# Patient Record
Sex: Male | Born: 1990 | Race: White | Hispanic: No | Marital: Single | Smoking: Former smoker
Health system: Southern US, Community
[De-identification: ages and names within clinical notes are randomized; demographics above are authoritative.]

## PROBLEM LIST (undated history)

## (undated) ENCOUNTER — Emergency Department (HOSPITAL_BASED_OUTPATIENT_CLINIC_OR_DEPARTMENT_OTHER): Admission: EM | Payer: Self-pay | Source: Home / Self Care

## (undated) DIAGNOSIS — Q688 Other specified congenital musculoskeletal deformities: Secondary | ICD-10-CM

## (undated) DIAGNOSIS — N289 Disorder of kidney and ureter, unspecified: Secondary | ICD-10-CM

## (undated) DIAGNOSIS — J45909 Unspecified asthma, uncomplicated: Secondary | ICD-10-CM

---

## 2013-11-10 ENCOUNTER — Emergency Department (HOSPITAL_COMMUNITY)
Admission: EM | Admit: 2013-11-10 | Discharge: 2013-11-11 | Disposition: A | Discharge status: Home / Self Care | Payer: Self-pay | Attending: Emergency Medicine | Admitting: Emergency Medicine

## 2013-11-10 ENCOUNTER — Encounter (HOSPITAL_COMMUNITY): Payer: Self-pay | Admitting: Emergency Medicine

## 2013-11-10 DIAGNOSIS — Z23 Encounter for immunization: Secondary | ICD-10-CM | POA: Insufficient documentation

## 2013-11-10 DIAGNOSIS — Y9301 Activity, walking, marching and hiking: Secondary | ICD-10-CM | POA: Insufficient documentation

## 2013-11-10 DIAGNOSIS — J45909 Unspecified asthma, uncomplicated: Secondary | ICD-10-CM | POA: Insufficient documentation

## 2013-11-10 DIAGNOSIS — F17200 Nicotine dependence, unspecified, uncomplicated: Secondary | ICD-10-CM | POA: Insufficient documentation

## 2013-11-10 DIAGNOSIS — W5911XA Bitten by nonvenomous snake, initial encounter: Secondary | ICD-10-CM | POA: Insufficient documentation

## 2013-11-10 DIAGNOSIS — R000 Tachycardia, unspecified: Secondary | ICD-10-CM | POA: Insufficient documentation

## 2013-11-10 DIAGNOSIS — T6391XA Toxic effect of contact with unspecified venomous animal, accidental (unintentional), initial encounter: Secondary | ICD-10-CM | POA: Insufficient documentation

## 2013-11-10 DIAGNOSIS — W5901XA Bitten by nonvenomous lizards, initial encounter: Secondary | ICD-10-CM | POA: Insufficient documentation

## 2013-11-10 DIAGNOSIS — Y9289 Other specified places as the place of occurrence of the external cause: Secondary | ICD-10-CM | POA: Insufficient documentation

## 2013-11-10 HISTORY — DX: Unspecified asthma, uncomplicated: J45.909

## 2013-11-10 LAB — COMPREHENSIVE METABOLIC PANEL
ALBUMIN: 4.6 g/dL (ref 3.5–5.2)
ALT: 17 U/L (ref 0–53)
AST: 20 U/L (ref 0–37)
Alkaline Phosphatase: 90 U/L (ref 39–117)
BUN: 7 mg/dL (ref 6–23)
CALCIUM: 9.1 mg/dL (ref 8.4–10.5)
CO2: 22 mEq/L (ref 19–32)
Chloride: 102 mEq/L (ref 96–112)
Creatinine, Ser: 0.67 mg/dL (ref 0.50–1.35)
GFR calc Af Amer: >90 mL/min (ref >90)
Glucose, Bld: 94 mg/dL (ref 70–99)
POTASSIUM: 3 meq/L — AB (ref 3.7–5.3)
Sodium: 144 mEq/L (ref 137–147)
Total Bilirubin: 0.4 mg/dL (ref 0.3–1.2)
Total Protein: 7.8 g/dL (ref 6.0–8.3)

## 2013-11-10 LAB — CBC WITH DIFFERENTIAL/PLATELET
BASOS ABS: 0.1 10*3/uL (ref 0.0–0.1)
BASOS PCT: 1 % (ref 0–1)
EOS ABS: 0.1 10*3/uL (ref 0.0–0.7)
EOS PCT: 1 % (ref 0–5)
HCT: 47.4 % (ref 39.0–52.0)
Hemoglobin: 16.4 g/dL (ref 13.0–17.0)
LYMPHS ABS: 5.9 10*3/uL — AB (ref 0.7–4.0)
Lymphocytes Relative: 45 % (ref 12–46)
MCH: 31.2 pg (ref 26.0–34.0)
MCHC: 34.6 g/dL (ref 30.0–36.0)
MCV: 90.3 fL (ref 78.0–100.0)
Monocytes Absolute: 1 10*3/uL (ref 0.1–1.0)
Monocytes Relative: 8 % (ref 3–12)
NEUTROS PCT: 47 % (ref 43–77)
Neutro Abs: 6.2 10*3/uL (ref 1.7–7.7)
PLATELETS: 268 10*3/uL (ref 150–400)
RBC: 5.25 MIL/uL (ref 4.22–5.81)
RDW: 13.4 % (ref 11.5–15.5)
WBC: 13.2 10*3/uL — ABNORMAL HIGH (ref 4.0–10.5)

## 2013-11-10 LAB — PROTIME-INR
INR: 1.01 (ref 0.00–1.49)
PROTHROMBIN TIME: 13.1 s (ref 11.6–15.2)

## 2013-11-10 LAB — FIBRINOGEN: Fibrinogen: 218 mg/dL (ref 204–475)

## 2013-11-10 MED ORDER — SODIUM CHLORIDE 0.9 % IV SOLN
1000.0000 mL | Freq: Once | INTRAVENOUS | Status: AC
  Administered 2013-11-10: 1000 mL via INTRAVENOUS

## 2013-11-10 MED ORDER — SODIUM CHLORIDE 0.9 % IV SOLN
1000.0000 mL | INTRAVENOUS | Status: DC
  Administered 2013-11-10: 1000 mL via INTRAVENOUS

## 2013-11-10 MED ORDER — TETANUS-DIPHTH-ACELL PERTUSSIS 5-2.5-18.5 LF-MCG/0.5 IM SUSP
0.5000 mL | Freq: Once | INTRAMUSCULAR | Status: AC
  Administered 2013-11-10: 0.5 mL via INTRAMUSCULAR
  Filled 2013-11-10: qty 0.5

## 2013-11-10 MED ORDER — HYDROMORPHONE HCL PF 1 MG/ML IJ SOLN
1.0000 mg | Freq: Once | INTRAMUSCULAR | Status: AC
  Administered 2013-11-10: 1 mg via INTRAVENOUS
  Filled 2013-11-10: qty 1

## 2013-11-10 NOTE — ED Provider Notes (Signed)
CSN: [id number]     Arrival date & time 11/10/13  2219 History   First MD Initiated Contact with Patient 11/10/13 2222     Chief Complaint  Patient presents with  . Snake Bite     (Consider location/radiation/quality/duration/timing/severity/associated sxs/prior Treatment) The history is provided by the patient and medical records.   This is a 23 year old male with past medical history significant for asthma, presenting to the ED for a snake bite to his right thumb. Bite occurred approximately 30 minutes prior to arrival while walking on a path outside of an apartment complex.  Pt states he does not know exactly what kind of snake it was, but appeared short in length and was tan with some sort of stripe. He states he's had sudden onset of pain, swelling, and bruising to his right thumb with some radiation into his right hand.  He denies numbness or paresthesias of right hand. Patient is right-hand dominant. Date of last tetanus unknown.  No ice or tourniquet was applied prior to arrival.  Pt tachycardic on arrival.  Past Medical History  Diagnosis Date  . Asthma    No past surgical history on file. No family history on file. History  Substance Use Topics  . Smoking status: Current Every Day Smoker -- 1.00 packs/day    Types: Cigarettes  . Smokeless tobacco: Not on file  . Alcohol Use: Yes     Comment: socially    Review of Systems  Skin: Positive for wound.  All other systems reviewed and are negative.     Allergies  Review of patient's allergies indicates no known allergies.  Home Medications   Prior to Admission medications   Not on File   BP 165/97  Temp(Src) 98.5 F (36.9 C) (Oral)  Resp 20  Ht 6\' 6"  (1.981 m)  Wt 180 lb (81.647 kg)  BMI 20.81 kg/m2  SpO2 99%  Physical Exam  Nursing note and vitals reviewed. Constitutional: He is oriented to person, place, and time. He appears well-developed and well-nourished. No distress.  HENT:  Head: Normocephalic and  atraumatic.  Mouth/Throat: Oropharynx is clear and moist.  Eyes: Conjunctivae and EOM are normal. Pupils are equal, round, and reactive to light.  Neck: Normal range of motion. Neck supple.  Cardiovascular: Regular rhythm and normal heart sounds.  Tachycardia present.   Pulmonary/Chest: Effort normal and breath sounds normal. No respiratory distress. He has no wheezes.  Abdominal: Soft. Bowel sounds are normal. There is no tenderness. There is no guarding.  Musculoskeletal: Normal range of motion. He exhibits no edema.       Right hand: He exhibits swelling.       Hands: Right distal thumb with obvious snake bite; 2 fang marks noted without active bleeding, some surrounding bruising at distal tip; surrounding swelling, erythema, and warmth to touch extending down thumb to MCP joint; no streaking into hand; full flexion/extension thumb maintained; strong radial pulse and cap refill; sensation intact diffusely throughout thumb and hand  Neurological: He is alert and oriented to person, place, and time.  Skin: Skin is warm and dry. He is not diaphoretic.  Psychiatric: He has a normal mood and affect.    ED Course  Procedures (including critical care time) Labs Review Labs Reviewed  CBC WITH DIFFERENTIAL - Abnormal; Notable for the following:    WBC 13.2 (*)    Lymphs Abs 5.9 (*)    All other components within normal limits  COMPREHENSIVE METABOLIC PANEL - Abnormal; Notable for the  following:    Potassium 3.0 (*)    All other components within normal limits  PROTIME-INR  FIBRINOGEN  COMPREHENSIVE METABOLIC PANEL  CBC WITH DIFFERENTIAL  FIBRINOGEN  PROTIME-INR    Imaging Review No results found.   EKG Interpretation None      MDM   Final diagnoses:  Snake bite   23 y.o. M with snake bite to right thumb 30 mins PTA.  Unidentified breed of snake.  On exam pt is afebrile and overall non-toxic appearing, tachycardia noted.  Hand has has bruising to distal tip with 2 fang marks  present; erythema, swelling, and warmth to touch extending down thumb to MCP joint. Pts hand was elevated in finger traps, area of redness and swelling marked.  NO ICE OR TOURNIQUET WAS APPLIED.  Tetanus updated.  Labs including CBC, CMP, PT-INR, and fibrinogen obtained.  Dilaudid given for pain.  Discussed with poison control-- Recommended observe for minimum of 6 hours with repeat labs at 4-6 hour intervals.  If redness/swelling extends past the next major joint line (wrist) or any signs of toxicity, should administer CroFab.  Labs reassuring.  Pt has been having Q9630mins re-checks, pain is controlled.  At 2345 erythema and swelling has extended slightly past thumb MCP joint but has not crossed wrist joint at this time.  Pt remains afebrile and non-toxic appearing.  His VS have stabilized.  Will plan observation for the recommended 6 hours, continue Q30 min checks.  Will administer CroFab if erythema, swelling, bruising extends beyond wrist or signs of toxicity develop.  Care signed out to Dr. Jodi MourningZavitz-- will continue monitoring closely and check repeat labs.  Garlon HatchetLisa M Arvil Utz, PA-C 11/11/13 0120

## 2013-11-10 NOTE — ED Notes (Signed)
Pt states that he was bitten to rt thumb by an unknown snake at 2200. Pt describes it as a small "baby" snake but it was dark and was unable to determine any markings; pt c/o swelling and pain to thumb area radiating to rt hand.

## 2013-11-11 LAB — CBC WITH DIFFERENTIAL/PLATELET
BASOS ABS: 0.1 10*3/uL (ref 0.0–0.1)
Basophils Relative: 0 % (ref 0–1)
Eosinophils Absolute: 0.1 10*3/uL (ref 0.0–0.7)
Eosinophils Relative: 0 % (ref 0–5)
HCT: 42.4 % (ref 39.0–52.0)
Hemoglobin: 14.5 g/dL (ref 13.0–17.0)
LYMPHS ABS: 3.4 10*3/uL (ref 0.7–4.0)
LYMPHS PCT: 28 % (ref 12–46)
MCH: 30.9 pg (ref 26.0–34.0)
MCHC: 34.2 g/dL (ref 30.0–36.0)
MCV: 90.2 fL (ref 78.0–100.0)
Monocytes Absolute: 1.1 10*3/uL — ABNORMAL HIGH (ref 0.1–1.0)
Monocytes Relative: 9 % (ref 3–12)
Neutro Abs: 7.6 10*3/uL (ref 1.7–7.7)
Neutrophils Relative %: 62 % (ref 43–77)
PLATELETS: 198 10*3/uL (ref 150–400)
RBC: 4.7 MIL/uL (ref 4.22–5.81)
RDW: 13.6 % (ref 11.5–15.5)
WBC: 12.2 10*3/uL — ABNORMAL HIGH (ref 4.0–10.5)

## 2013-11-11 LAB — COMPREHENSIVE METABOLIC PANEL
ALBUMIN: 3.7 g/dL (ref 3.5–5.2)
ALK PHOS: 69 U/L (ref 39–117)
ALT: 13 U/L (ref 0–53)
AST: 16 U/L (ref 0–37)
BUN: 5 mg/dL — AB (ref 6–23)
CO2: 21 mEq/L (ref 19–32)
CREATININE: 0.63 mg/dL (ref 0.50–1.35)
Calcium: 8.2 mg/dL — ABNORMAL LOW (ref 8.4–10.5)
Chloride: 102 mEq/L (ref 96–112)
GFR calc Af Amer: >90 mL/min (ref >90)
Glucose, Bld: 95 mg/dL (ref 70–99)
Potassium: 3.9 mEq/L (ref 3.7–5.3)
Sodium: 138 mEq/L (ref 137–147)
Total Bilirubin: 0.3 mg/dL (ref 0.3–1.2)
Total Protein: 6.1 g/dL (ref 6.0–8.3)

## 2013-11-11 LAB — PROTIME-INR
INR: 1.1 (ref 0.00–1.49)
PROTHROMBIN TIME: 14 s (ref 11.6–15.2)

## 2013-11-11 LAB — FIBRINOGEN: FIBRINOGEN: 184 mg/dL — AB (ref 204–475)

## 2013-11-11 MED ORDER — FENTANYL CITRATE 0.05 MG/ML IJ SOLN
75.0000 ug | Freq: Once | INTRAMUSCULAR | Status: AC
  Administered 2013-11-11: 75 ug via INTRAVENOUS
  Filled 2013-11-11: qty 2

## 2013-11-11 MED ORDER — HYDROMORPHONE HCL PF 1 MG/ML IJ SOLN
1.0000 mg | INTRAMUSCULAR | Status: DC | PRN
  Administered 2013-11-11 (×2): 1 mg via INTRAVENOUS
  Filled 2013-11-11 (×2): qty 1

## 2013-11-11 MED ORDER — HYDROCODONE-ACETAMINOPHEN 5-325 MG PO TABS: 1.0000 | ORAL_TABLET | ORAL | Status: DC | PRN

## 2013-11-11 NOTE — ED Provider Notes (Signed)
Signed out plan to monitor for the morning and repeat labs and recheck. Multiple rechecks and pain medicines given to improve pain. Minimal severity based on [facility] pharmacy protocol. Ecchymosis and swelling limited to the distal thumb and mild erythema did not progress to the wrist. Patient has distal sensation and despite swelling patient has normal cap refill.   Patient low risk based on guidelines and plan for close followup outpatient. Strict reasons to return given.   Labs Reviewed  CBC WITH DIFFERENTIAL - Abnormal; Notable for the following:    WBC 13.2 (*)    Lymphs Abs 5.9 (*)    All other components within normal limits  COMPREHENSIVE METABOLIC PANEL - Abnormal; Notable for the following:    Potassium 3.0 (*)    All other components within normal limits  COMPREHENSIVE METABOLIC PANEL - Abnormal; Notable for the following:    BUN 5 (*)    Calcium 8.2 (*)    All other components within normal limits  CBC WITH DIFFERENTIAL - Abnormal; Notable for the following:    WBC 12.2 (*)    Monocytes Absolute 1.1 (*)    All other components within normal limits  FIBRINOGEN - Abnormal; Notable for the following:    Fibrinogen 184 (*)    All other components within normal limits  PROTIME-INR  FIBRINOGEN  PROTIME-INR   Araceli Bouche Thomson Herbers   Enid Skeens, MD 11/11/13 [phone]

## 2013-11-11 NOTE — ED Provider Notes (Signed)
Medical screening examination/treatment/procedure(s) were conducted as a shared visit with non-physician practitioner(s) and myself.  I personally evaluated the patient during the encounter.   EKG Interpretation None      I interviewed and examined the patient. Lungs are CTAB. Cardiac exam wnl. Abdomen soft.  Snake bite to right thumb. Affected extremity elevated and immobilized. Initiated pain control and IVF. Screening labs sent. Pt continues to appear well on repeat checks w/ no progression of swelling past the wrist on my last assessment. Pt will be observed in the ED for a minimum of 6 hrs per poison control.   Junius Argyle, MD 11/11/13 1210

## 2013-11-11 NOTE — Discharge Instructions (Signed)
Keep hand elevated as often as possible Return to the ER if the bruising or redness spreads into your wrist or you develop new or worsening symptoms  For severe pain take norco or vicodin however realize they have the potential for addiction and it can make you sleepy and has tylenol in it.  No operating machinery while taking.  If you were given medicines take as directed.  If you are on coumadin or contraceptives realize their levels and effectiveness is altered by many different medicines.  If you have any reaction (rash, tongues swelling, other) to the medicines stop taking and see a physician.   Please follow up as directed and return to the ER or see a physician for new or worsening symptoms.  Thank you. Filed Vitals:   11/10/13 2228 11/11/13 0122 11/11/13 0356 11/11/13 0551  BP: 165/97 102/81 131/73 127/63  Pulse:  79 70 70  Temp: 98.5 F (36.9 C)   98 F (36.7 C)  TempSrc: Oral   Oral  Resp: 20 12    Height: 6\' 6"  (1.981 m)     Weight: 180 lb (81.647 kg)     SpO2: 99% 98% 96% 98%

## 2014-11-24 ENCOUNTER — Emergency Department (HOSPITAL_COMMUNITY)
Admission: EM | Admit: 2014-11-24 | Discharge: 2014-11-24 | Disposition: A | Discharge status: Home / Self Care | Payer: No Typology Code available for payment source | Attending: Emergency Medicine | Admitting: Emergency Medicine

## 2014-11-24 ENCOUNTER — Encounter (HOSPITAL_COMMUNITY): Payer: Self-pay | Admitting: *Deleted

## 2014-11-24 DIAGNOSIS — W25XXXA Contact with sharp glass, initial encounter: Secondary | ICD-10-CM | POA: Diagnosis not present

## 2014-11-24 DIAGNOSIS — Y939 Activity, unspecified: Secondary | ICD-10-CM | POA: Diagnosis not present

## 2014-11-24 DIAGNOSIS — Z720 Tobacco use: Secondary | ICD-10-CM | POA: Insufficient documentation

## 2014-11-24 DIAGNOSIS — J45909 Unspecified asthma, uncomplicated: Secondary | ICD-10-CM | POA: Diagnosis not present

## 2014-11-24 DIAGNOSIS — Y929 Unspecified place or not applicable: Secondary | ICD-10-CM | POA: Diagnosis not present

## 2014-11-24 DIAGNOSIS — S61411A Laceration without foreign body of right hand, initial encounter: Secondary | ICD-10-CM | POA: Insufficient documentation

## 2014-11-24 DIAGNOSIS — Y999 Unspecified external cause status: Secondary | ICD-10-CM | POA: Insufficient documentation

## 2014-11-24 DIAGNOSIS — Z87448 Personal history of other diseases of urinary system: Secondary | ICD-10-CM | POA: Diagnosis not present

## 2014-11-24 HISTORY — DX: Disorder of kidney and ureter, unspecified: N28.9

## 2014-11-24 MED ORDER — LIDOCAINE-EPINEPHRINE (PF) 2 %-1:200000 IJ SOLN
10.0000 mL | Freq: Once | INTRAMUSCULAR | Status: DC
  Filled 2014-11-24: qty 20

## 2014-11-24 NOTE — ED Notes (Signed)
Pt stable, ambulatory, brother at bedside, states understanding of discharge instructions

## 2014-11-24 NOTE — ED Notes (Signed)
Patient cut palm of right hand with a glass  3 inch laceration.  Area wrapped by EMS  Ibuprofen taken at home

## 2014-11-24 NOTE — Discharge Instructions (Signed)
Sutures may be removed in 5-7 days. This can be done by urgent care, your doctor, or the ER.  Watch for signs of infection:  Redness, swelling, drainage or pus   Sutured Wound Care Sutures are stitches that can be used to close wounds. Wound care helps prevent pain and infection.  HOME CARE INSTRUCTIONS   Rest and elevate the injured area until all the pain and swelling are gone.  Only take over-the-counter or prescription medicines for pain, discomfort, or fever as directed by your caregiver.  After 48 hours, gently wash the area with mild soap and water once a day, or as directed. Rinse off the soap. Pat the area dry with a clean towel. Do not rub the wound. This may cause bleeding.  Follow your caregiver's instructions for how often to change the bandage (dressing). Stop using a dressing after 2 days or after the wound stops draining.  If the dressing sticks, moisten it with soapy water and gently remove it.  Apply ointment on the wound as directed.  Avoid stretching a sutured wound.  Drink enough fluids to keep your urine clear or pale yellow.  Follow up with your caregiver for suture removal as directed.  Use sunscreen on your wound for the next 3 to 6 months so the scar will not darken. SEEK IMMEDIATE MEDICAL CARE IF:   Your wound becomes red, swollen, hot, or tender.  You have increasing pain in the wound.  You have a red streak that extends from the wound.  There is pus coming from the wound.  You have a fever.  You have shaking chills.  There is a bad smell coming from the wound.  You have persistent bleeding from the wound. MAKE SURE YOU:   Understand these instructions.  Will watch your condition.  Will get help right away if you are not doing well or get worse. Document Released: 07/14/2004 Document Revised: 08/29/2011 Document Reviewed: 10/10/2010 [facility] Patient Information 2015 [employer], [place]. This information is not intended to replace advice given  to you by your health care provider. Make sure you discuss any questions you have with your health care provider.

## 2014-11-24 NOTE — ED Provider Notes (Addendum)
CSN: [id number]     Arrival date & time 11/24/14  0104 History  This chart was scribed for Marisa Severinlga Usbaldo Pannone, MD by Bronson CurbJacqueline Melvin, ED Scribe. This patient was seen in room D32C/D32C and the patient's care was started at 3:36 AM.   Chief Complaint  Patient presents with  . Laceration    The history is provided by the patient. No language interpreter was used.     HPI Comments: Terry Simmons is a 24 y.o. male who presents to the Emergency Department complaining of a laceration to the palmar aspect of the right hand sustained when he cut his hand on a piece of glass PTA. There is associated constant, 8/10 pain to the area. Bleeding is controlled with bandage applied by EMS. Patient has taken ibuprofen PTA. He denies any other injuries. Tetanus is UTD. NKDA.   Past Medical History  Diagnosis Date  . Asthma   . Renal disorder    History reviewed. No pertinent past surgical history. No family history on file. History  Substance Use Topics  . Smoking status: Current Every Day Smoker -- 1.00 packs/day    Types: Cigarettes  . Smokeless tobacco: Never Used  . Alcohol Use: Yes     Comment: socially    Review of Systems  Musculoskeletal: Positive for myalgias.  Skin: Positive for wound.      Allergies  Review of patient's allergies indicates no known allergies.  Home Medications   Prior to Admission medications   Medication Sig Start Date End Date Taking? Authorizing Provider  HYDROcodone-acetaminophen (NORCO) 5-325 MG per tablet Take 1-2 tablets by mouth every 4 (four) hours as needed. 11/11/13   Blane OharaJoshua Zavitz, MD  ibuprofen (ADVIL,MOTRIN) 200 MG tablet Take 400 mg by mouth every 6 (six) hours as needed (pain).    Historical Provider, MD   Triage Vitals: BP 115/61 mmHg  Pulse 75  Temp(Src) 98 F (36.7 C) (Oral)  Resp 18  Ht 6\' 1"  (1.854 m)  Wt 180 lb (81.647 kg)  BMI 23.75 kg/m2  SpO2 99%  Physical Exam  Constitutional: He is oriented to person, place, and time. He  appears well-developed and well-nourished. No distress.  HENT:  Head: Normocephalic and atraumatic.  Eyes: Conjunctivae and EOM are normal.  Neck: Neck supple. No tracheal deviation present.  Cardiovascular: Normal rate.   Pulmonary/Chest: Effort normal. No respiratory distress.  Musculoskeletal: Normal range of motion.  Neurological: He is alert and oriented to person, place, and time.  Skin: Skin is warm and dry. Laceration noted.  4cm laceration to the right palm.  Psychiatric: He has a normal mood and affect. His behavior is normal.  Nursing note and vitals reviewed.   ED Course  Procedures (including critical care time)  DIAGNOSTIC STUDIES: Oxygen Saturation is 100% on room air, normal by my interpretation.    COORDINATION OF CARE: At 0339 Discussed treatment plan with patient which includes laceration repair. Patient agrees.   LACERATION REPAIR PROCEDURE NOTE The patient's identification was confirmed and consent was obtained. This procedure was performed by Marisa Severinlga Martha Soltys, MD at 3:41 AM. Site: Palmar aspect of right hand Sterile procedures observed Anesthetic used (type and amt): 2% lidocaine with epi Suture type/size:4-0 Prolene Length:4 cm # of Sutures: 12 Technique: simple interrupted  Complexity: simple Antibx ointment applied Tetanus UTD Site anesthetized, irrigated with NS, explored without evidence of foreign body, wound well approximated, site covered with dry, sterile dressing.  Patient tolerated procedure well without complications. Instructions for care discussed verbally and  patient provided with additional written instructions for homecare and f/u.   Labs Review Labs Reviewed - No data to display  Imaging Review No results found.   EKG Interpretation None      MDM   Final diagnoses:  Hand laceration, right, initial encounter    24 year old male status post laceration to right hand from glass.  Tetanus is up-to-date.  Laceration  repaired.  I personally performed the services described in this documentation, which was scribed in my presence. The recorded information has been reviewed and is accurate.     Marisa Severin, MD 11/24/14 1610  Marisa Severin, MD 11/24/14 [phone]

## 2018-08-10 DIAGNOSIS — R509 Fever, unspecified: Secondary | ICD-10-CM | POA: Diagnosis not present

## 2018-08-10 DIAGNOSIS — J111 Influenza due to unidentified influenza virus with other respiratory manifestations: Secondary | ICD-10-CM | POA: Diagnosis not present

## 2019-01-06 DIAGNOSIS — Z20828 Contact with and (suspected) exposure to other viral communicable diseases: Secondary | ICD-10-CM | POA: Diagnosis not present

## 2019-05-03 DIAGNOSIS — F1210 Cannabis abuse, uncomplicated: Secondary | ICD-10-CM | POA: Diagnosis not present

## 2019-05-03 DIAGNOSIS — R002 Palpitations: Secondary | ICD-10-CM | POA: Diagnosis not present

## 2019-05-03 DIAGNOSIS — N99114 Postprocedural urethral stricture, male, unspecified: Secondary | ICD-10-CM | POA: Diagnosis not present

## 2019-05-03 DIAGNOSIS — Z0000 Encounter for general adult medical examination without abnormal findings: Secondary | ICD-10-CM | POA: Diagnosis not present

## 2019-05-03 DIAGNOSIS — H43393 Other vitreous opacities, bilateral: Secondary | ICD-10-CM | POA: Diagnosis not present

## 2020-05-03 DIAGNOSIS — J069 Acute upper respiratory infection, unspecified: Secondary | ICD-10-CM | POA: Diagnosis not present

## 2020-05-03 DIAGNOSIS — Z20822 Contact with and (suspected) exposure to covid-19: Secondary | ICD-10-CM | POA: Diagnosis not present

## 2020-05-06 DIAGNOSIS — J029 Acute pharyngitis, unspecified: Secondary | ICD-10-CM | POA: Diagnosis not present

## 2020-06-05 DIAGNOSIS — Z13220 Encounter for screening for lipoid disorders: Secondary | ICD-10-CM | POA: Diagnosis not present

## 2020-06-05 DIAGNOSIS — Z23 Encounter for immunization: Secondary | ICD-10-CM | POA: Diagnosis not present

## 2020-06-05 DIAGNOSIS — Z0000 Encounter for general adult medical examination without abnormal findings: Secondary | ICD-10-CM | POA: Diagnosis not present

## 2020-06-05 DIAGNOSIS — H43393 Other vitreous opacities, bilateral: Secondary | ICD-10-CM | POA: Diagnosis not present

## 2020-10-13 ENCOUNTER — Emergency Department (HOSPITAL_COMMUNITY): Payer: Commercial Managed Care - PPO

## 2020-10-13 ENCOUNTER — Other Ambulatory Visit: Payer: Self-pay

## 2020-10-13 ENCOUNTER — Emergency Department (HOSPITAL_COMMUNITY)
Admission: EM | Admit: 2020-10-13 | Discharge: 2020-10-13 | Disposition: A | Discharge status: Home / Self Care | Payer: Commercial Managed Care - PPO | Attending: Emergency Medicine | Admitting: Emergency Medicine

## 2020-10-13 DIAGNOSIS — N451 Epididymitis: Secondary | ICD-10-CM | POA: Insufficient documentation

## 2020-10-13 DIAGNOSIS — D72829 Elevated white blood cell count, unspecified: Secondary | ICD-10-CM | POA: Insufficient documentation

## 2020-10-13 DIAGNOSIS — R110 Nausea: Secondary | ICD-10-CM | POA: Insufficient documentation

## 2020-10-13 DIAGNOSIS — J45909 Unspecified asthma, uncomplicated: Secondary | ICD-10-CM | POA: Diagnosis not present

## 2020-10-13 DIAGNOSIS — N50812 Left testicular pain: Secondary | ICD-10-CM | POA: Diagnosis present

## 2020-10-13 DIAGNOSIS — F17210 Nicotine dependence, cigarettes, uncomplicated: Secondary | ICD-10-CM | POA: Insufficient documentation

## 2020-10-13 LAB — BASIC METABOLIC PANEL
Anion gap: 10 (ref 5–15)
BUN: 14 mg/dL (ref 6–20)
CO2: 24 mmol/L (ref 22–32)
Calcium: 9.4 mg/dL (ref 8.9–10.3)
Chloride: 104 mmol/L (ref 98–111)
Creatinine, Ser: 0.71 mg/dL (ref 0.61–1.24)
GFR, Estimated: >60 mL/min (ref >60)
Glucose, Bld: 92 mg/dL (ref 70–99)
Potassium: 3.7 mmol/L (ref 3.5–5.1)
Sodium: 138 mmol/L (ref 135–145)

## 2020-10-13 LAB — CBC WITH DIFFERENTIAL/PLATELET
Abs Immature Granulocytes: 0.13 10*3/uL — ABNORMAL HIGH (ref 0.00–0.07)
Basophils Absolute: 0.1 10*3/uL (ref 0.0–0.1)
Basophils Relative: 1 %
Eosinophils Absolute: 0.1 10*3/uL (ref 0.0–0.5)
Eosinophils Relative: 0 %
HCT: 48.3 % (ref 39.0–52.0)
Hemoglobin: 15.9 g/dL (ref 13.0–17.0)
Immature Granulocytes: 1 %
Lymphocytes Relative: 21 %
Lymphs Abs: 2.8 10*3/uL (ref 0.7–4.0)
MCH: 30.1 pg (ref 26.0–34.0)
MCHC: 32.9 g/dL (ref 30.0–36.0)
MCV: 91.5 fL (ref 80.0–100.0)
Monocytes Absolute: 0.9 10*3/uL (ref 0.1–1.0)
Monocytes Relative: 7 %
Neutro Abs: 9.8 10*3/uL — ABNORMAL HIGH (ref 1.7–7.7)
Neutrophils Relative %: 70 %
Platelets: 245 10*3/uL (ref 150–400)
RBC: 5.28 MIL/uL (ref 4.22–5.81)
RDW: 13.5 % (ref 11.5–15.5)
WBC: 13.8 10*3/uL — ABNORMAL HIGH (ref 4.0–10.5)
nRBC: 0 % (ref 0.0–0.2)

## 2020-10-13 LAB — URINALYSIS, ROUTINE W REFLEX MICROSCOPIC
Bilirubin Urine: NEGATIVE
Glucose, UA: NEGATIVE mg/dL
Hgb urine dipstick: NEGATIVE
Ketones, ur: NEGATIVE mg/dL
Nitrite: NEGATIVE
Protein, ur: NEGATIVE mg/dL
Specific Gravity, Urine: 1.025 (ref 1.005–1.030)
pH: 6 (ref 5.0–8.0)

## 2020-10-13 LAB — HIV ANTIBODY (ROUTINE TESTING W REFLEX): HIV Screen 4th Generation wRfx: NONREACTIVE

## 2020-10-13 MED ORDER — LEVOFLOXACIN 500 MG PO TABS: 500.0000 mg | ORAL_TABLET | Freq: Every day | ORAL | 0 refills | Status: AC

## 2020-10-13 MED ORDER — LEVOFLOXACIN 500 MG PO TABS
500.0000 mg | ORAL_TABLET | Freq: Once | ORAL | Status: AC
  Administered 2020-10-13: 500 mg via ORAL
  Filled 2020-10-13: qty 1

## 2020-10-13 MED ORDER — ACETAMINOPHEN 325 MG PO TABS: 650.0000 mg | ORAL_TABLET | Freq: Four times a day (QID) | ORAL | 0 refills | Status: DC | PRN

## 2020-10-13 MED ORDER — IBUPROFEN 800 MG PO TABS: 800.0000 mg | ORAL_TABLET | Freq: Three times a day (TID) | ORAL | 0 refills | Status: AC

## 2020-10-13 NOTE — ED Triage Notes (Signed)
Emergency Medicine Provider Triage Evaluation Note  Terry Simmons , a 30 y.o. male  was evaluated in triage.  Pt complains of testicular pain.  Review of Systems  Positive: Left testicle pain, testicular swelling, bloody ejaculate Negative: Fever, back pain, hematuria, rash, trauma  Physical Exam  BP (!) 153/98 (BP Location: Right Arm)   Pulse 87   Temp 99.1 F (37.3 C) (Oral)   Resp 20   SpO2 100%  Gen:   Awake, no distress   HEENT:  Atraumatic  Resp:  Normal effort  Cardiac:  Normal rate  Abd:   Nondistended, nontender. Chaperone present during exam.  Tenderness to L testicle with firmness without significant erythema or edema. No inguinal lymphadenopathy MSK:   Moves extremities without difficulty  Neuro:  Speech clear   Medical Decision Making  Medically screening exam initiated at 3:30 PM.  Appropriate orders placed.  Adama Ferber [facility] was informed that the remainder of the evaluation will be completed by another provider, this initial triage assessment does not replace that evaluation, and the importance of remaining in the ED until their evaluation is complete.  Clinical Impression  L testicular tenderness 3 hrs ago after he was gardening.  Last sexual activity was 2 days ago.  No hx of kidney stone.    Fayrene Helper, PA-C 10/13/20 [phone]

## 2020-10-13 NOTE — Discharge Instructions (Addendum)
We are treating you for a likely infection of the testicle.  You received your first dose of antibiotic in the ER today.  Your next dose will be tomorrow.  You will take this once a day for the next 9 days.  I have also prescribed you ibuprofen and Tylenol for pain.  We have also tested you for gonorrhea and chlamydia, which are sexually transmitted diseases.  If you test positive for this, you will receive a phone call the next 2 to 3 days.  You will likely need to be on different antibiotics, and may be instructed to go to a nearby urgent care or doctors office for treatment.  If you do test positive for sexual transmitted disease, you need to inform your sexual partner to get tested and treated.  You should avoid for having any sexual contact or ejaculation for 2 weeks while treating this infection.

## 2020-10-13 NOTE — ED Triage Notes (Signed)
Pt presents to ED POV. Pt c/o L testicle pain. Pt reports that he was sitting outside and pain began after sitting down. Pt reports swelling now and blood in semen.

## 2020-10-13 NOTE — ED Provider Notes (Signed)
[facility] EMERGENCY DEPARTMENT Provider Note   CSN: [id number] Arrival date & time: 10/13/20  1508     History Chief Complaint  Patient presents with  . Testicle Pain    Terry Simmons is a 30 y.o. male presented emergency department left testicular pain.  He reports onset of pain around noon today.  He says he was out gardening all day.  He describes a throbbing pain in his left testicle with some nausea.  He says he has had similar episodes in the past and last a few hours and improved on their own.  He does note some bloody ejaculate.  He reports he is in monogamous relationship with his wife, and has not had any other sexual partners in at least 5 years.  He denies any history of STI.  He denies any allergies or any other medical problems.  HPI     Past Medical History:  Diagnosis Date  . Asthma   . Renal disorder     There are no problems to display for this patient.   No past surgical history on file.     No family history on file.  Social History   Tobacco Use  . Smoking status: Current Every Day Smoker    Packs/day: 1.00    Types: Cigarettes  . Smokeless tobacco: Never Used  Substance Use Topics  . Alcohol use: Yes    Comment: socially  . Drug use: Yes    Types: Marijuana    Home Medications Prior to Admission medications   Medication Sig Start Date End Date Taking? Authorizing Provider  acetaminophen (TYLENOL) 325 MG tablet Take 2 tablets (650 mg total) by mouth every 6 (six) hours as needed for up to 30 doses for mild pain or moderate pain. 10/13/20  Yes Terald Sleeper, MD  ibuprofen (ADVIL) 800 MG tablet Take 1 tablet (800 mg total) by mouth 3 (three) times daily for 30 doses. 10/13/20 10/23/20 Yes Nakai Pollio, Kermit Balo, MD  levofloxacin (LEVAQUIN) 500 MG tablet Take 1 tablet (500 mg total) by mouth daily for 9 days. 10/14/20 10/23/20 Yes Trygve Thal, Kermit Balo, MD  HYDROcodone-acetaminophen (NORCO) 5-325 MG per tablet Take 1-2  tablets by mouth every 4 (four) hours as needed. 11/11/13   Blane Ohara, MD  ibuprofen (ADVIL,MOTRIN) 200 MG tablet Take 400 mg by mouth every 6 (six) hours as needed (pain).    [provider]    Allergies    Patient has no known allergies.  Review of Systems   Review of Systems  Constitutional: Negative for chills and fever.  Respiratory: Negative for cough and shortness of breath.   Cardiovascular: Negative for chest pain and palpitations.  Gastrointestinal: Positive for nausea. Negative for abdominal pain and vomiting.  Genitourinary: Positive for scrotal swelling and testicular pain. Negative for dysuria.  Musculoskeletal: Negative for arthralgias and back pain.  Skin: Negative for color change and rash.  Neurological: Negative for seizures and syncope.  All other systems reviewed and are negative.   Physical Exam Updated Vital Signs BP (!) 153/98 (BP Location: Right Arm)   Pulse 87   Temp 99.1 F (37.3 C) (Oral)   Resp 20   SpO2 100%   Physical Exam Constitutional:      General: He is not in acute distress. HENT:     Head: Normocephalic and atraumatic.  Eyes:     Conjunctiva/sclera: Conjunctivae normal.     Pupils: Pupils are equal, round, and reactive to light.  Cardiovascular:  Rate and Rhythm: Normal rate and regular rhythm.  Pulmonary:     Effort: Pulmonary effort is normal. No respiratory distress.  Abdominal:     General: There is no distension.     Tenderness: There is no abdominal tenderness.  Genitourinary:    Penis: Normal and circumcised.      Testes: Normal. Cremasteric reflex is present.        Right: Mass, tenderness, swelling, testicular hydrocele or varicocele not present. Right testis is descended.     Epididymis:     Right: Normal. Not inflamed or enlarged.     Comments: Left testicle mildly enlarged, testicular and epidymal tenderness No discoloration of scrotum Skin:    General: Skin is warm and dry.  Neurological:      General: No focal deficit present.     Mental Status: He is alert. Mental status is at baseline.  Psychiatric:        Mood and Affect: Mood normal.        Behavior: Behavior normal.     ED Results / Procedures / Treatments   Labs (all labs ordered are listed, but only abnormal results are displayed) Labs Reviewed  CBC WITH DIFFERENTIAL/PLATELET - Abnormal; Notable for the following components:      Result Value   WBC 13.8 (*)    Neutro Abs 9.8 (*)    Abs Immature Granulocytes 0.13 (*)    All other components within normal limits  URINALYSIS, ROUTINE W REFLEX MICROSCOPIC - Abnormal; Notable for the following components:   APPearance HAZY (*)    Leukocytes,Ua TRACE (*)    Bacteria, UA RARE (*)    All other components within normal limits  BASIC METABOLIC PANEL  HIV ANTIBODY (ROUTINE TESTING W REFLEX)  RPR  GC/CHLAMYDIA PROBE AMP ([place]) NOT AT [facility]    EKG None  Radiology US SCROTUM W/DOPPLER  Result Date: 10/13/2020 CLINICAL DATA:  Acute left testicular pain. EXAM: SCROTAL ULTRASOUND DOPPLER ULTRASOUND OF THE TESTICLES TECHNIQUE: Complete ultrasound examination of the testicles, epididymis, and other scrotal structures was performed. Color and spectral Doppler ultrasound were also utilized to evaluate blood flow to the testicles. COMPARISON:  None. FINDINGS: Right testicle Measurements: 5.5 x 3.3 x 2.5 cm. No mass or microlithiasis visualized. Left testicle Measurements: 5.0 x 3.8 x 2.8 cm. No mass or microlithiasis visualized. Hypervascular compared to right testicle. Right epididymis:  Normal in size and appearance. Left epididymis:  Tail is hypervascular suggesting epididymitis. Hydrocele:  None visualized. Varicocele:  Small left varicocele is noted. Pulsed Doppler interrogation of both testes demonstrates normal low resistance arterial and venous waveforms bilaterally. IMPRESSION: The left testicle and epididymis are hypervascular suggesting left-sided epididymo-orchitis.  There is no evidence of testicular torsion or mass. Electronically Signed   By: Lupita Raider M.D.   On: 10/13/2020 16:42    Procedures Procedures   Medications Ordered in ED Medications  levofloxacin (LEVAQUIN) tablet 500 mg (500 mg Oral Given 10/13/20 1809)    ED Course  I have reviewed the triage vital signs and the nursing notes.  Pertinent labs & imaging results that were available during my care of the patient were reviewed by me and considered in my medical decision making (see chart for details).  Patient is here with evidence of epididymitis per clinical exam and ultrasound.  Labs reviewed with a mild leukocytosis 13.4.  I do not believe he has sepsis.  I suspect this most likely caused by enteric organism or E. coli.  He reports  he is in a monogamous relationship and does not feel he is at risk for STI.  We agreed that we will test for GC chlamydia, but given his history, I think it is more reasonable to treat him with Levaquin for enteric organisms.  We will start him on a 10-day course.  Advise NSAIDs and Tylenol for pain.  He is aware that if he chlamydia or gonorrhea test positive, he will receive a call and needed alternative treatment for this.  Final Clinical Impression(s) / ED Diagnoses Final diagnoses:  Epididymitis    Rx / DC Orders ED Discharge Orders         Ordered    levofloxacin (LEVAQUIN) 500 MG tablet  Daily        10/13/20 1752    ibuprofen (ADVIL) 800 MG tablet  3 times daily        10/13/20 1752    acetaminophen (TYLENOL) 325 MG tablet  Every 6 hours PRN        10/13/20 1752           Terald Sleeper, MD 10/13/20 2202

## 2020-10-14 LAB — GC/CHLAMYDIA PROBE AMP (~~LOC~~) NOT AT ARMC
Chlamydia: NEGATIVE
Comment: NEGATIVE
Comment: NORMAL
Neisseria Gonorrhea: NEGATIVE

## 2020-10-14 LAB — RPR: RPR Ser Ql: NONREACTIVE

## 2021-03-06 ENCOUNTER — Emergency Department (HOSPITAL_BASED_OUTPATIENT_CLINIC_OR_DEPARTMENT_OTHER): Payer: Commercial Managed Care - PPO

## 2021-03-06 ENCOUNTER — Emergency Department (HOSPITAL_BASED_OUTPATIENT_CLINIC_OR_DEPARTMENT_OTHER)
Admission: EM | Admit: 2021-03-06 | Discharge: 2021-03-06 | Disposition: A | Discharge status: Home / Self Care | Payer: Commercial Managed Care - PPO | Attending: Emergency Medicine | Admitting: Emergency Medicine

## 2021-03-06 ENCOUNTER — Other Ambulatory Visit: Payer: Self-pay

## 2021-03-06 ENCOUNTER — Encounter (HOSPITAL_BASED_OUTPATIENT_CLINIC_OR_DEPARTMENT_OTHER): Payer: Self-pay

## 2021-03-06 DIAGNOSIS — N50812 Left testicular pain: Secondary | ICD-10-CM | POA: Diagnosis present

## 2021-03-06 DIAGNOSIS — I861 Scrotal varices: Secondary | ICD-10-CM | POA: Insufficient documentation

## 2021-03-06 DIAGNOSIS — F17210 Nicotine dependence, cigarettes, uncomplicated: Secondary | ICD-10-CM | POA: Diagnosis not present

## 2021-03-06 DIAGNOSIS — J45909 Unspecified asthma, uncomplicated: Secondary | ICD-10-CM | POA: Diagnosis not present

## 2021-03-06 DIAGNOSIS — N451 Epididymitis: Secondary | ICD-10-CM

## 2021-03-06 LAB — URINALYSIS, ROUTINE W REFLEX MICROSCOPIC
Bilirubin Urine: NEGATIVE
Glucose, UA: NEGATIVE mg/dL
Ketones, ur: 15 mg/dL — AB
Nitrite: NEGATIVE
Specific Gravity, Urine: 1.032 — ABNORMAL HIGH (ref 1.005–1.030)
pH: 6 (ref 5.0–8.0)

## 2021-03-06 MED ORDER — CEFTRIAXONE SODIUM 500 MG IJ SOLR
500.0000 mg | Freq: Once | INTRAMUSCULAR | Status: AC
  Administered 2021-03-06: 500 mg via INTRAMUSCULAR
  Filled 2021-03-06: qty 500

## 2021-03-06 MED ORDER — LIDOCAINE HCL (PF) 1 % IJ SOLN
INTRAMUSCULAR | Status: AC
  Administered 2021-03-06: 5 mL
  Filled 2021-03-06: qty 5

## 2021-03-06 MED ORDER — DOXYCYCLINE HYCLATE 100 MG PO CAPS: 100.0000 mg | ORAL_CAPSULE | Freq: Two times a day (BID) | ORAL | 0 refills | Status: DC

## 2021-03-06 NOTE — ED Triage Notes (Signed)
Pt c/o swollen left testicle. 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 Prescribed Levofloxacin which he reports usually helps after 10 days. Pt is on day 4 of 500MG  dose. He states this feels like every other time, which always consists of L testicle swelling but wanted to confirm. Followed by urology and has an apt for surgery in October.

## 2021-03-06 NOTE — ED Provider Notes (Signed)
[facility] EMERGENCY DEPT Provider Note   CSN: [id number] Arrival date & time: 03/06/21  1502     History No chief complaint on file.   Terry Simmons is a 30 y.o. male.  HPI  Patient presents with left testicular pain and swelling.  This started 4 days ago, he has history of the same.  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  He is on day 4 of Levaquin.  Reports that that has not been helping, in the past it has.  Has not had any fevers at home, does note the occasional discharge from the penis.  Denies any recent blood in the urine, states he has had blood in the urethral meatus in the past but that was over a month ago.  Past Medical History:  Diagnosis Date  . Asthma   . Renal disorder     There are no problems to display for this patient.   History reviewed. No pertinent surgical history.     No family history on file.  Social History   Tobacco Use  . Smoking status: Every Day    Packs/day: 1.00    Types: Cigarettes  . Smokeless tobacco: Never  Substance Use Topics  . Alcohol use: Yes    Comment: socially  . Drug use: Yes    Types: Marijuana    Home Medications Prior to Admission medications   Medication Sig Start Date End Date Taking? Authorizing Provider  acetaminophen (TYLENOL) 325 MG tablet Take 2 tablets (650 mg total) by mouth every 6 (six) hours as needed for up to 30 doses for mild pain or moderate pain. 10/13/20   Terald Sleeper, MD  HYDROcodone-acetaminophen (NORCO) 5-325 MG per tablet Take 1-2 tablets by mouth every 4 (four) hours as needed. 11/11/13   Blane Ohara, MD  ibuprofen (ADVIL,MOTRIN) 200 MG tablet Take 400 mg by mouth every 6 (six) hours as needed (pain).    [provider]    Allergies    Patient has no known allergies.  Review of Systems   Review of Systems  Constitutional:  Negative for chills, fatigue and fever.  HENT:  Negative for ear  pain and sore throat.   Eyes:  Negative for pain and visual disturbance.  Respiratory:  Negative for cough and shortness of breath.   Cardiovascular:  Negative for chest pain and palpitations.  Gastrointestinal:  Negative for abdominal pain and vomiting.  Genitourinary:  Positive for penile discharge, scrotal swelling and testicular pain. Negative for dysuria, hematuria and penile swelling.  Musculoskeletal:  Negative for arthralgias and back pain.  Skin:  Negative for color change and rash.  Neurological:  Negative for seizures and syncope.  All other systems reviewed and are negative.  Physical Exam Updated Vital Signs BP 137/88 (BP Location: Left Arm)   Pulse 90   Temp (!) 97 F (36.1 C) (Oral)   Resp 18   Ht 6\' 1"  (1.854 m)   Wt 111.1 kg   SpO2 99%   BMI 32.32 kg/m   Physical Exam Vitals and nursing note reviewed. Exam conducted with a chaperone present.  Constitutional:      General: He is not in acute distress.    Appearance: Normal appearance.  HENT:     Head: Normocephalic and atraumatic.  Eyes:     General: No scleral icterus.    Extraocular Movements: Extraocular movements intact.     Pupils: Pupils are equal,  round, and reactive to light.  Cardiovascular:     Rate and Rhythm: Normal rate and regular rhythm.     Pulses: Normal pulses.     Heart sounds: Normal heart sounds.  Pulmonary:     Effort: Pulmonary effort is normal.     Breath sounds: Normal breath sounds.  Genitourinary:    Penis: Normal.      Comments: No penile discharge, urethra without any erythema.  Left testicle is erythematous and swollen. Skin:    Coloration: Skin is not jaundiced.  Neurological:     Mental Status: He is alert. Mental status is at baseline.     Coordination: Coordination normal.    ED Results / Procedures / Treatments   Labs (all labs ordered are listed, but only abnormal results are displayed) Labs Reviewed  URINALYSIS, ROUTINE W REFLEX MICROSCOPIC  GC/CHLAMYDIA  PROBE AMP ([place]) NOT AT [facility]    EKG None  Radiology No results found.  Procedures Procedures   Medications Ordered in ED Medications - No data to display  ED Course  I have reviewed the triage vital signs and the nursing notes.  Pertinent labs & imaging results that were available during my care of the patient were reviewed by me and considered in my medical decision making (see chart for details).    MDM Rules/Calculators/A&P                           Patient vitals are stable, he has known history of epididymitis.  He is having testicular swelling so we will rule out testicular torsion with Doppler ultrasound.  We will check urine for UTI symptoms as well as gonorrhea and chlamydia given he is having penile discharge.  Patient has not been having any improvement on fluoroquinolones, will give him a shot of Rocephin and start him on doxycycline.  Ultrasound negative for torsion.  He does have left epidermidis as well as a possible right orchitis.  He is stable at this time, has urology follow-up scheduled for October.  Advised patient to proceed with that plan and to schedule follow-up with his urologist in the next 10 days if there is no improvement at the end of the antibiotic course.  Final Clinical Impression(s) / ED Diagnoses Final diagnoses:  None    Rx / DC Orders ED Discharge Orders     None        Theron Arista, PA-C 03/06/21 1910    Rozelle Logan, DO 03/07/21 1522

## 2021-03-06 NOTE — Discharge Instructions (Addendum)
Take doxycycline twice daily for the next 10 days. Follow-up with urologist as planned in October. If things change or worsen return back to the ED as needed.

## 2021-03-08 ENCOUNTER — Encounter (HOSPITAL_BASED_OUTPATIENT_CLINIC_OR_DEPARTMENT_OTHER): Payer: Self-pay

## 2021-06-17 ENCOUNTER — Other Ambulatory Visit: Payer: Self-pay

## 2021-06-17 ENCOUNTER — Encounter (HOSPITAL_BASED_OUTPATIENT_CLINIC_OR_DEPARTMENT_OTHER): Payer: Self-pay | Admitting: *Deleted

## 2021-06-17 ENCOUNTER — Emergency Department (HOSPITAL_BASED_OUTPATIENT_CLINIC_OR_DEPARTMENT_OTHER)
Admission: EM | Admit: 2021-06-17 | Discharge: 2021-06-17 | Disposition: A | Discharge status: Home / Self Care | Payer: Commercial Managed Care - PPO | Attending: Emergency Medicine | Admitting: Emergency Medicine

## 2021-06-17 DIAGNOSIS — Z202 Contact with and (suspected) exposure to infections with a predominantly sexual mode of transmission: Secondary | ICD-10-CM | POA: Diagnosis not present

## 2021-06-17 DIAGNOSIS — Z5321 Procedure and treatment not carried out due to patient leaving prior to being seen by health care provider: Secondary | ICD-10-CM | POA: Diagnosis not present

## 2021-06-17 NOTE — ED Notes (Signed)
Pt has elected to leave facility.

## 2021-06-17 NOTE — ED Triage Notes (Addendum)
Pt concerned his wife tested Positive for GC/chlamydia, he presently has no symptoms but was told to come for testing.

## 2022-08-22 ENCOUNTER — Emergency Department (HOSPITAL_COMMUNITY)
Admission: EM | Admit: 2022-08-22 | Discharge: 2022-08-22 | Disposition: A | Discharge status: Home / Self Care | Payer: Commercial Managed Care - PPO | Attending: Emergency Medicine | Admitting: Emergency Medicine

## 2022-08-22 ENCOUNTER — Encounter (HOSPITAL_COMMUNITY): Payer: Self-pay | Admitting: Emergency Medicine

## 2022-08-22 ENCOUNTER — Emergency Department (HOSPITAL_COMMUNITY): Payer: Commercial Managed Care - PPO

## 2022-08-22 DIAGNOSIS — D72829 Elevated white blood cell count, unspecified: Secondary | ICD-10-CM | POA: Insufficient documentation

## 2022-08-22 DIAGNOSIS — N451 Epididymitis: Secondary | ICD-10-CM

## 2022-08-22 DIAGNOSIS — N50812 Left testicular pain: Secondary | ICD-10-CM

## 2022-08-22 LAB — CBC WITH DIFFERENTIAL/PLATELET
Abs Immature Granulocytes: 0.1 10*3/uL — ABNORMAL HIGH (ref 0.00–0.07)
Basophils Absolute: 0.1 10*3/uL (ref 0.0–0.1)
Basophils Relative: 1 %
Eosinophils Absolute: 0 10*3/uL (ref 0.0–0.5)
Eosinophils Relative: 0 %
HCT: 49.7 % (ref 39.0–52.0)
Hemoglobin: 16.8 g/dL (ref 13.0–17.0)
Immature Granulocytes: 1 %
Lymphocytes Relative: 13 %
Lymphs Abs: 1.4 10*3/uL (ref 0.7–4.0)
MCH: 30.5 pg (ref 26.0–34.0)
MCHC: 33.8 g/dL (ref 30.0–36.0)
MCV: 90.4 fL (ref 80.0–100.0)
Monocytes Absolute: 0.9 10*3/uL (ref 0.1–1.0)
Monocytes Relative: 8 %
Neutro Abs: 8.4 10*3/uL — ABNORMAL HIGH (ref 1.7–7.7)
Neutrophils Relative %: 77 %
Platelets: 220 10*3/uL (ref 150–400)
RBC: 5.5 MIL/uL (ref 4.22–5.81)
RDW: 13.2 % (ref 11.5–15.5)
WBC: 10.8 10*3/uL — ABNORMAL HIGH (ref 4.0–10.5)
nRBC: 0 % (ref 0.0–0.2)

## 2022-08-22 LAB — URINALYSIS, ROUTINE W REFLEX MICROSCOPIC
Bilirubin Urine: NEGATIVE
Glucose, UA: NEGATIVE mg/dL
Ketones, ur: 20 mg/dL — AB
Nitrite: POSITIVE — AB
Protein, ur: NEGATIVE mg/dL
Specific Gravity, Urine: 1.014 (ref 1.005–1.030)
WBC, UA: >50 WBC/hpf (ref 0–5)
pH: 6 (ref 5.0–8.0)

## 2022-08-22 LAB — COMPREHENSIVE METABOLIC PANEL
ALT: 17 U/L (ref 0–44)
AST: 18 U/L (ref 15–41)
Albumin: 4 g/dL (ref 3.5–5.0)
Alkaline Phosphatase: 80 U/L (ref 38–126)
Anion gap: 11 (ref 5–15)
BUN: 5 mg/dL — ABNORMAL LOW (ref 6–20)
CO2: 23 mmol/L (ref 22–32)
Calcium: 9.1 mg/dL (ref 8.9–10.3)
Chloride: 102 mmol/L (ref 98–111)
Creatinine, Ser: 0.82 mg/dL (ref 0.61–1.24)
GFR, Estimated: >60 mL/min (ref >60)
Glucose, Bld: 111 mg/dL — ABNORMAL HIGH (ref 70–99)
Potassium: 3.7 mmol/L (ref 3.5–5.1)
Sodium: 136 mmol/L (ref 135–145)
Total Bilirubin: 0.8 mg/dL (ref 0.3–1.2)
Total Protein: 7.6 g/dL (ref 6.5–8.1)

## 2022-08-22 LAB — HIV ANTIBODY (ROUTINE TESTING W REFLEX): HIV Screen 4th Generation wRfx: NONREACTIVE

## 2022-08-22 MED ORDER — IBUPROFEN 600 MG PO TABS: 600.0000 mg | ORAL_TABLET | Freq: Three times a day (TID) | ORAL | 0 refills | Status: AC | PRN

## 2022-08-22 MED ORDER — CEFTRIAXONE SODIUM 500 MG IJ SOLR: 500.0000 mg | Freq: Once | INTRAMUSCULAR | Status: DC

## 2022-08-22 MED ORDER — SODIUM CHLORIDE 0.9 % IV SOLN
1000.0000 mg | Freq: Once | INTRAVENOUS | Status: AC
  Administered 2022-08-22: 1000 mg via INTRAVENOUS
  Filled 2022-08-22: qty 10

## 2022-08-22 MED ORDER — DOXYCYCLINE HYCLATE 100 MG PO CAPS: 100.0000 mg | ORAL_CAPSULE | Freq: Two times a day (BID) | ORAL | 0 refills | Status: AC

## 2022-08-22 NOTE — ED Triage Notes (Signed)
Hx epididymidis  in left testicle and states feels the same. Pain and swelling over the weekend. Took ibuprofen.

## 2022-08-22 NOTE — Discharge Instructions (Signed)
Thank you for letting us take care of you today.  We gave you a shot of Rocephin and will start you on doxycycline to treat your infection. Please follow up with urology in a week to discuss your recurrent epididymitis or sooner for any worsening symptoms. You may take ibuprofen as prescribed for pain or discomfort. You may also take Tylenol as needed. You may take up to '1000mg'$  at once every 6 hours. Do not take more than '4000mg'$  in one day.  Please take the antibiotics as prescribed and complete the entire course even if you start to feel better.  If you develop significantly worsening symptoms such as severe abdominal pain, high fevers, inability urinate, or any other new, emergent concerns, please return to the ED for reevaluation.

## 2022-08-23 LAB — RPR: RPR Ser Ql: NONREACTIVE

## 2022-08-24 NOTE — ED Provider Notes (Signed)
[facility] Provider Note   CSN: [id number] Arrival date & time: 08/22/22  [id number]     History  Chief Complaint  Patient presents with   Testicle Pain    Terry Simmons is a 32 y.o. male presenting to ED c/o left testicular pain over the last 2 to 3 days. 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 He reports he has taken Levaquin before but it does not seem to work well and symptoms did not fully resolve. He reports last episode in fall 2022 he was prescribed doxycycline and had good relief of symptoms with this medication. He denies fever, chills, abdominal pain, dysuria, hematuria, back pain, nausea, vomiting, or diarrhea. He requests STD testing while here. He denies history of previous STDs. He denies injury to testicles or scrotum.        Home Medications Prior to Admission medications   Medication Sig Start Date End Date Taking? Authorizing Provider  doxycycline (VIBRAMYCIN) 100 MG capsule Take 1 capsule (100 mg total) by mouth 2 (two) times daily for 10 days. 08/22/22 09/01/22 Yes Marck Mcclenny L, PA-C  ibuprofen (ADVIL) 600 MG tablet Take 1 tablet (600 mg total) by mouth every 8 (eight) hours as needed for up to 5 days for mild pain or moderate pain. 08/22/22 08/27/22 Yes Alexiana Laverdure L, PA-C  acetaminophen (TYLENOL) 325 MG tablet Take 2 tablets (650 mg total) by mouth every 6 (six) hours as needed for up to 30 doses for mild pain or moderate pain. 10/13/20   Wyvonnia Dusky, MD  HYDROcodone-acetaminophen (NORCO) 5-325 MG per tablet Take 1-2 tablets by mouth every 4 (four) hours as needed. 11/11/13   Elnora Morrison, MD  penicillin v potassium (VEETID) 500 MG tablet Take 500 mg by mouth 4 (four) times daily.    [provider]      Allergies    Patient has no known allergies.    Review of Systems   Review of Systems  All other systems reviewed and are negative.   Physical  Exam Updated Vital Signs BP (!) 138/93   Pulse 98   Temp 98.2 F (36.8 C) (Oral)   Resp 18   SpO2 97%  Physical Exam Vitals and nursing note reviewed. Exam conducted with a chaperone present.  Constitutional:      General: He is not in acute distress.    Appearance: Normal appearance. He is not ill-appearing or toxic-appearing.  HENT:     Head: Normocephalic and atraumatic.     Mouth/Throat:     Mouth: Mucous membranes are moist.  Eyes:     General: No scleral icterus.    Conjunctiva/sclera: Conjunctivae normal.  Cardiovascular:     Rate and Rhythm: Normal rate and regular rhythm.     Heart sounds: No murmur heard. Pulmonary:     Effort: Pulmonary effort is normal.     Breath sounds: Normal breath sounds.  Abdominal:     General: Abdomen is flat. There is no distension.     Palpations: Abdomen is soft. There is no mass.     Tenderness: There is no abdominal tenderness. There is no right CVA tenderness, left CVA tenderness, guarding or rebound.     Hernia: No hernia is present.  Genitourinary:    Penis: Normal.      Testes: Normal.     Comments: No overlying skin changes, no discharge, no rash or lesions Musculoskeletal:  General: Normal range of motion.     Cervical back: Neck supple.     Right lower leg: No edema.     Left lower leg: No edema.  Skin:    General: Skin is warm and dry.     Capillary Refill: Capillary refill takes less than 2 seconds.  Neurological:     Mental Status: He is alert. Mental status is at baseline.  Psychiatric:        Behavior: Behavior normal.     ED Results / Procedures / Treatments   Labs (all labs ordered are listed, but only abnormal results are displayed) Labs Reviewed  CBC WITH DIFFERENTIAL/PLATELET - Abnormal; Notable for the following components:      Result Value   WBC 10.8 (*)    Neutro Abs 8.4 (*)    Abs Immature Granulocytes 0.10 (*)    All other components within normal limits  COMPREHENSIVE METABOLIC PANEL -  Abnormal; Notable for the following components:   Glucose, Bld 111 (*)    BUN 5 (*)    All other components within normal limits  URINALYSIS, ROUTINE W REFLEX MICROSCOPIC - Abnormal; Notable for the following components:   APPearance HAZY (*)    Hgb urine dipstick SMALL (*)    Ketones, ur 20 (*)    Nitrite POSITIVE (*)    Leukocytes,Ua LARGE (*)    Bacteria, UA MANY (*)    All other components within normal limits  RPR  HIV ANTIBODY (ROUTINE TESTING W REFLEX)  GC/CHLAMYDIA PROBE AMP ([place]) NOT AT [facility]    EKG None  Radiology US SCROTUM W/DOPPLER  Result Date: 08/22/2022 CLINICAL DATA:  Left scrotal pain EXAM: SCROTAL ULTRASOUND DOPPLER ULTRASOUND OF THE TESTICLES TECHNIQUE: Complete ultrasound examination of the testicles, epididymis, and other scrotal structures was performed. Color and spectral Doppler ultrasound were also utilized to evaluate blood flow to the testicles. COMPARISON:  Previous studies including the examination of 03/06/2021 FINDINGS: Right testicle Measurements: 4.1 x 2.4 x 2.7 cm. No mass or microlithiasis visualized. Left testicle Measurements: 4.3 x 2.3 x 3.2 cm. No mass or microlithiasis visualized. Right epididymis: Technologist observed possible ectasia of vascular structures in the right epididymis. This finding is not evident in the submitted images. Left epididymis: There is ectasia of vessels in the left epididymis. There is increased vascularity in the left epididymis. This finding appears less prominent in comparison with the previous examination. Hydrocele:  None visualized. Varicocele:  None visualized. Pulsed Doppler interrogation of both testes demonstrates normal low resistance arterial and venous waveforms bilaterally. IMPRESSION: There is no evidence of testicular torsion. There is increased vascularity in left epididymis, possibly suggesting mild left epididymitis. Electronically Signed   By: Elmer Picker M.D.   On: 08/22/2022 10:04     Procedures Procedures    Medications Ordered in ED Medications  cefTRIAXone (ROCEPHIN) 1,000 mg in sodium chloride 0.9 % 100 mL IVPB (0 mg Intravenous Stopped 08/22/22 1147)    ED Course/ Medical Decision Making/ A&P                             Medical Decision Making Amount and/or Complexity of Data Reviewed Labs: ordered. Decision-making details documented in ED Course. Radiology: ordered. Decision-making details documented in ED Course.  Risk Prescription drug management.   Medical Decision Making:   Unice Angier is a 32 y.o. male who presented to the ED today with testicular pain detailed above.  External chart has been reviewed including previous ED visits. Patient's presentation is complicated by their history of recurrent epididymitis.  Complete initial physical exam performed, notably the patient  was in no acute distress with stable vital signs. Abdomen was soft, nontender, and without rebound, guarding, or peritoneal signs. No CVA tenderness. GU exam essentially unremarkable with no significant swelling to tests, no overlying skin changes, no penile discharge, no rash or lesion, no obvious signs of torsion.    Reviewed and confirmed nursing documentation for past medical history, family history, social history.    Initial Assessment:   With the patient's presentation of testicular pain, most likely diagnosis is epididymitis. Differential diagnosis includes but is not limited to UTI, gonorrhea, chlamydia, testicular torsion, ureteral stone, orchitis, acute abdomen. This is most consistent with an acute complicated illness  Initial Plan:  Screening labs including CBC and Metabolic panel to evaluate for infectious or metabolic etiology of disease.  Urinalysis with reflex culture ordered to evaluate for UTI or relevant urologic/nephrologic pathology.  STD panel Scrotal ultrasound to rule out torsion and other pathology Objective evaluation as reviewed    Initial Study Results:   Laboratory  All laboratory results reviewed without evidence of clinically relevant pathology.   Exceptions include: WBC 10.8, UA with small Hgb and large leukocytes and nitrite positive  Radiology:  All images reviewed independently. Agree with radiology report at this time.   US SCROTUM W/DOPPLER  Result Date: 08/22/2022 CLINICAL DATA:  Left scrotal pain EXAM: SCROTAL ULTRASOUND DOPPLER ULTRASOUND OF THE TESTICLES TECHNIQUE: Complete ultrasound examination of the testicles, epididymis, and other scrotal structures was performed. Color and spectral Doppler ultrasound were also utilized to evaluate blood flow to the testicles. COMPARISON:  Previous studies including the examination of 03/06/2021 FINDINGS: Right testicle Measurements: 4.1 x 2.4 x 2.7 cm. No mass or microlithiasis visualized. Left testicle Measurements: 4.3 x 2.3 x 3.2 cm. No mass or microlithiasis visualized. Right epididymis: Technologist observed possible ectasia of vascular structures in the right epididymis. This finding is not evident in the submitted images. Left epididymis: There is ectasia of vessels in the left epididymis. There is increased vascularity in the left epididymis. This finding appears less prominent in comparison with the previous examination. Hydrocele:  None visualized. Varicocele:  None visualized. Pulsed Doppler interrogation of both testes demonstrates normal low resistance arterial and venous waveforms bilaterally. IMPRESSION: There is no evidence of testicular torsion. There is increased vascularity in left epididymis, possibly suggesting mild left epididymitis. Electronically Signed   By: Elmer Picker M.D.   On: 08/22/2022 10:04     Final Assessment and Plan:   This is a 32 year old male presenting to ED for left testicular pain. Pt has history of recurrent epididymitis but has not had an episode in over a year. Previously, he has had success with doxy therapy. Recent onset  of symptoms as above without associated fever, chills, nausea, vomiting, abdominal pain, or urinary symptoms. No injury to scrotum. On exam, testes appear reasonably symmetrical without signs of significant swelling, overlying skin changes. No obvious signs of torsion. Abdomen is soft and nontender without peritoneal signs. No CVA tenderness. Pt afebrile with stable vital signs. Workup obtained as above for further evaluation. Pt with mild leukocytosis and UA as above. UA findings likely secondary to acute epididymitis but could be a separate cystitis as well. Discussed pt case with attending MD who is in agreement to cover pt with dose of Rocephin here and outpatient course of doxycycline which should  improve both conditions. Pt STD panel also sent at his request. He will follow up on results via [place]. Discussed with pt need for close outpatient follow up with urology. Pt expressed understanding of this. Given strict ED return precautions, all questions answered, and pt stable for discharge.    Clinical Impression:  1. Epididymitis   2. Left testicular pain      Discharge           Final Clinical Impression(s) / ED Diagnoses Final diagnoses:  Epididymitis  Left testicular pain    Rx / DC Orders ED Discharge Orders          Ordered    doxycycline (VIBRAMYCIN) 100 MG capsule  2 times daily        08/22/22 1132    ibuprofen (ADVIL) 600 MG tablet  Every 8 hours PRN        08/22/22 1132              Suzzette Righter, PA-C 08/24/22 0143    Pattricia Boss, MD 08/24/22 1438

## 2022-09-12 IMAGING — US US SCROTUM W/ DOPPLER COMPLETE
1 series · 14 of 25 positions shown · non-contrast
Comparison: None.

CLINICAL DATA: Acute left testicular pain.

EXAM:
SCROTAL ULTRASOUND
DOPPLER ULTRASOUND OF THE TESTICLES
TECHNIQUE: Complete ultrasound examination of the testicles, epididymis, and
other scrotal structures was performed. Color and spectral Doppler
ultrasound were also utilized to evaluate blood flow to the
testicles.

[Series 1: us scrotum w/doppler · 14 of 80 slices shown]
[im 1/80]
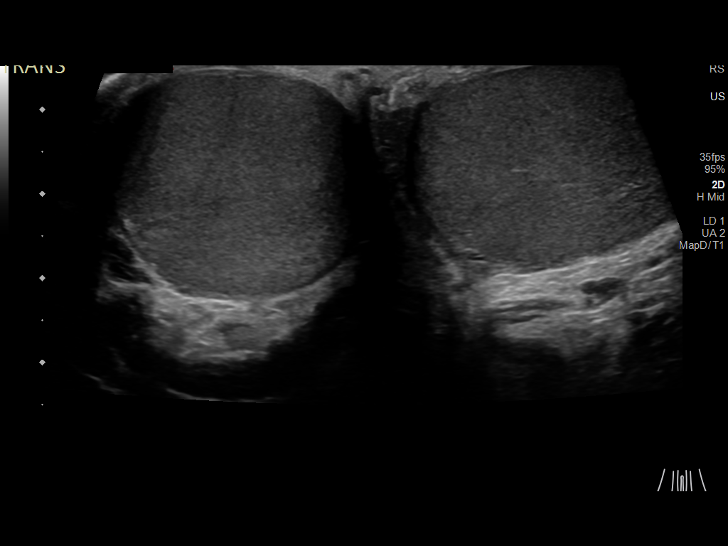
[im 7/80]
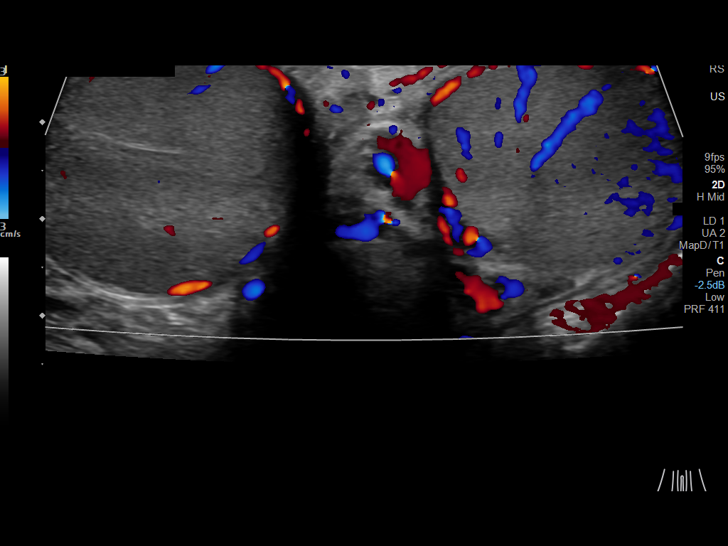
[im 14/80]
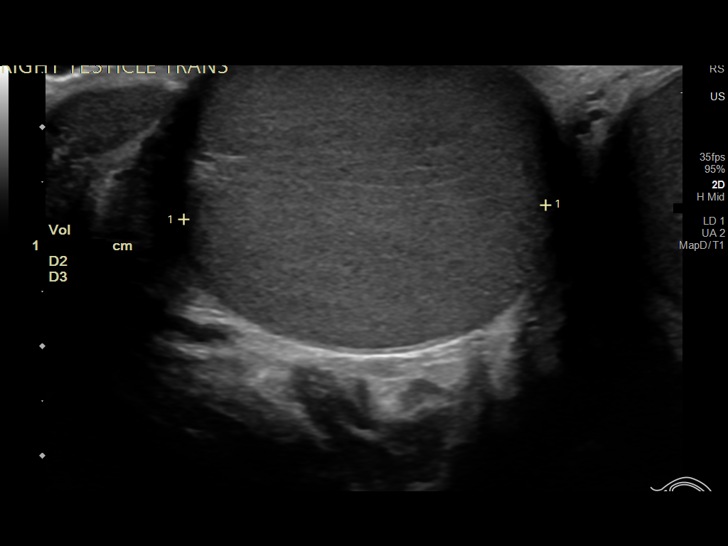
[im 20/80]
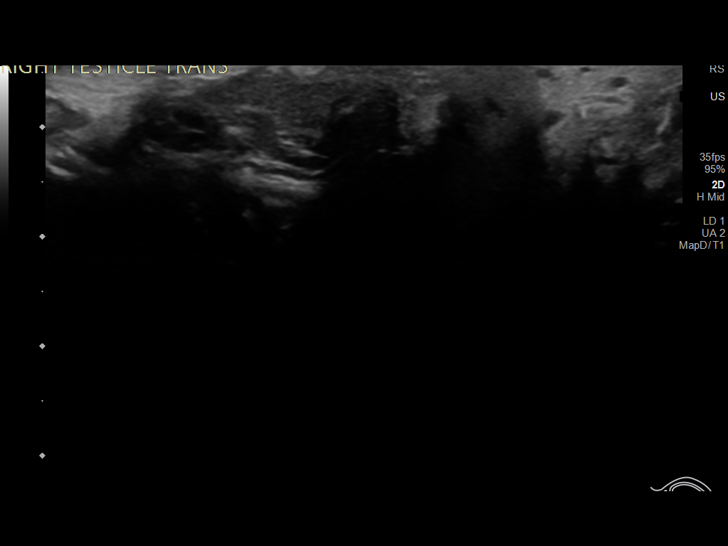
[im 27/80]
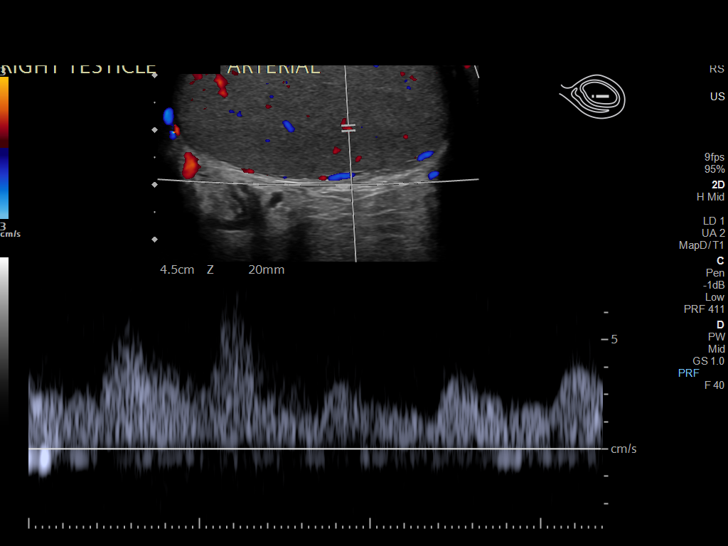
[im 30/80]
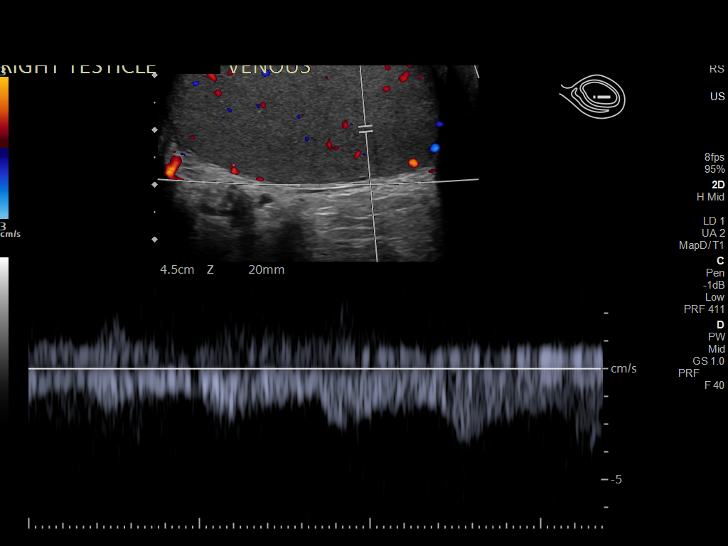
[im 37/80]
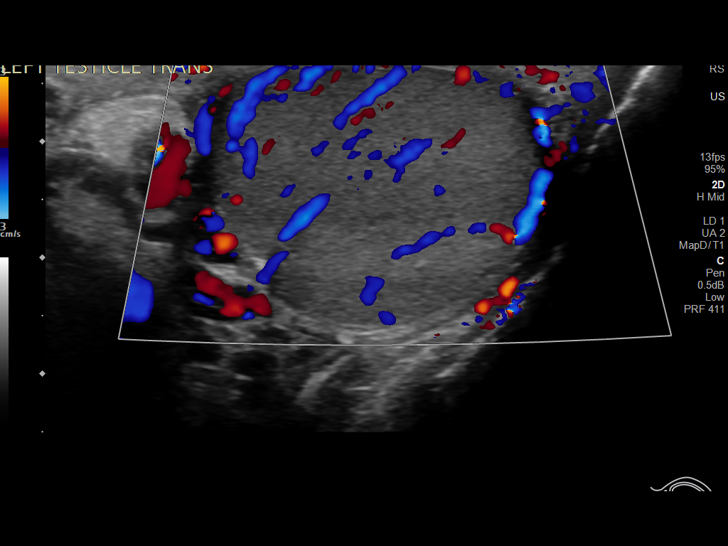
[im 43/80]
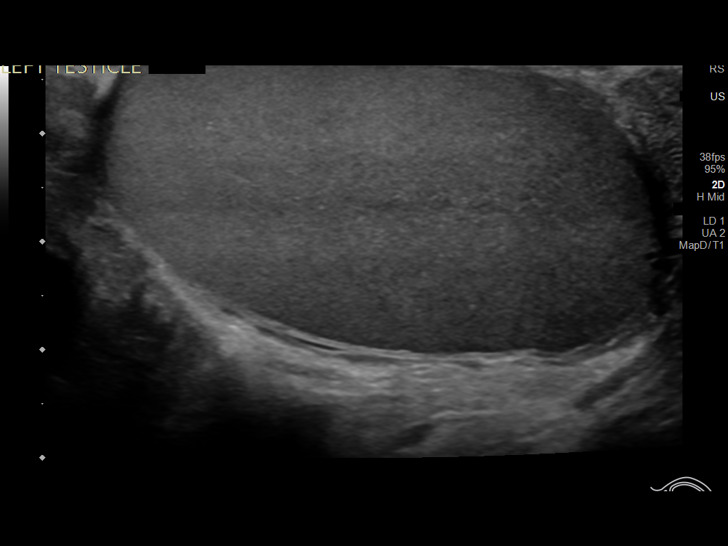
[im 50/80]
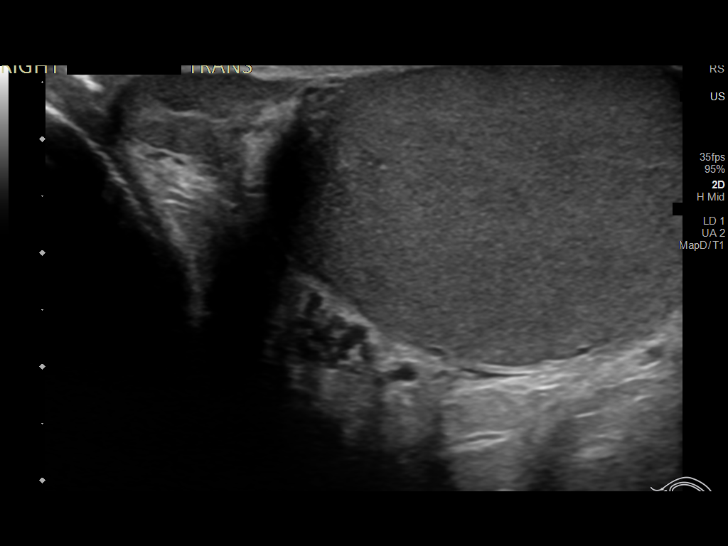
[im 53/80]
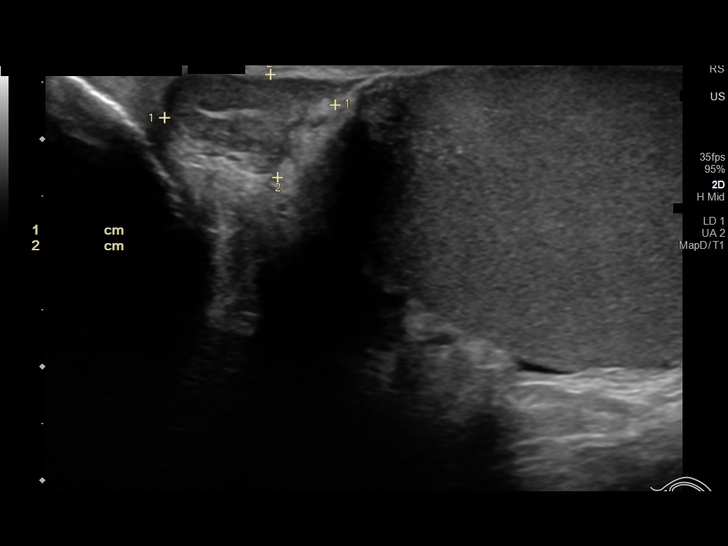
[im 60/80]
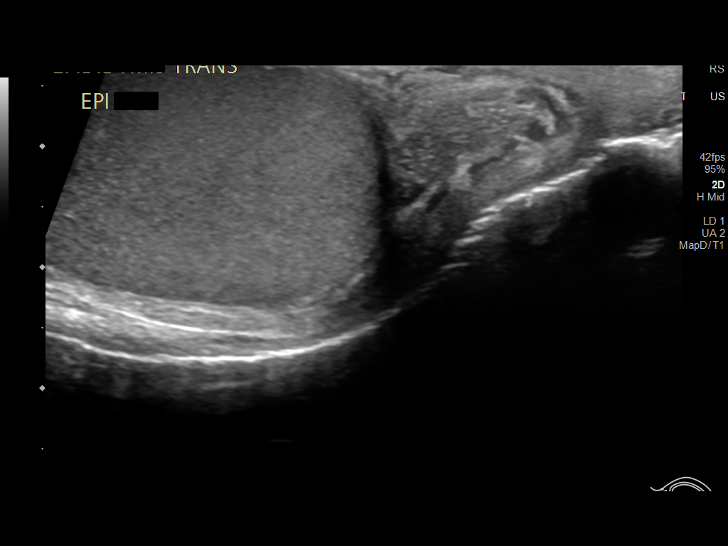
[im 66/80]
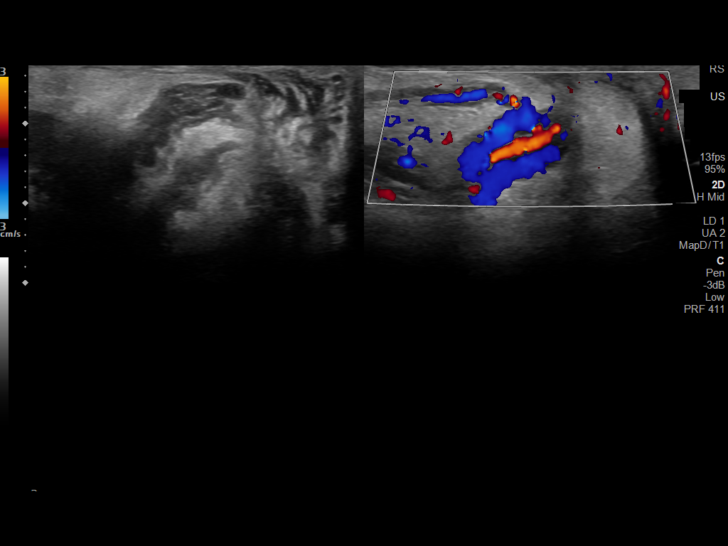
[im 73/80]
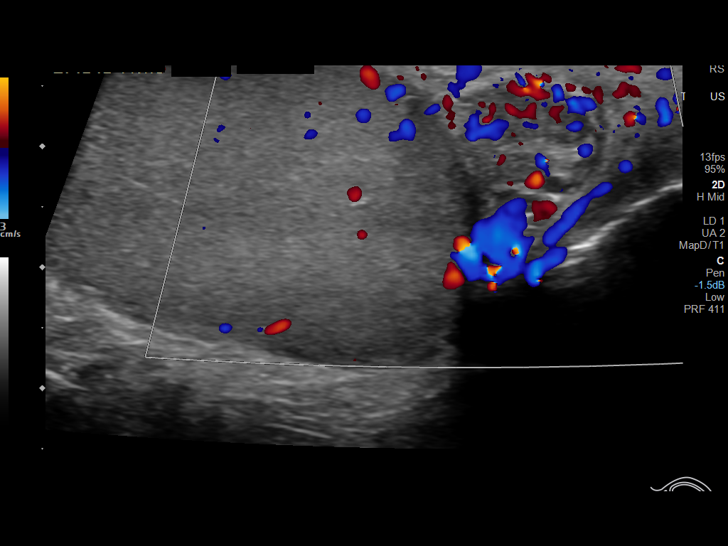
[im 80/80]
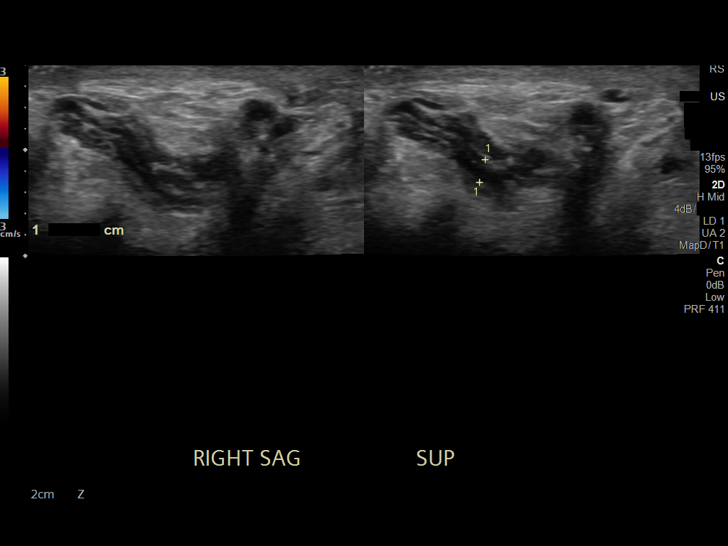

[14 of 25 positions shown; findings below may reference images not displayed]

FINDINGS: Right testicle

Measurements: 5.5 x 3.3 x 2.5 cm. No mass or microlithiasis
visualized.

Left testicle

Measurements: 5.0 x 3.8 x 2.8 cm. No mass or microlithiasis
visualized. Hypervascular compared to right testicle.

Right epididymis:  Normal in size and appearance.

Left epididymis:  Tail is hypervascular suggesting epididymitis.

Hydrocele:  None visualized.

Varicocele:  Small left varicocele is noted.

Pulsed Doppler interrogation of both testes demonstrates normal low
resistance arterial and venous waveforms bilaterally.
IMPRESSION: The left testicle and epididymis are hypervascular suggesting
left-sided epididymo-orchitis. There is no evidence of testicular
torsion or mass.

## 2023-02-03 IMAGING — US US SCROTUM W/ DOPPLER COMPLETE
1 series · 13 of 25 positions shown · non-contrast
Comparison: 10/13/2020

CLINICAL DATA: Testicular pain, history of left epididymitis

EXAM:
SCROTAL ULTRASOUND
DOPPLER ULTRASOUND OF THE TESTICLES
TECHNIQUE: Complete ultrasound examination of the testicles, epididymis, and
other scrotal structures was performed. Color and spectral Doppler
ultrasound were also utilized to evaluate blood flow to the
testicles.

[Series 1: us scrotum w/doppler · 13 of 140 slices shown]
[im 1/140]
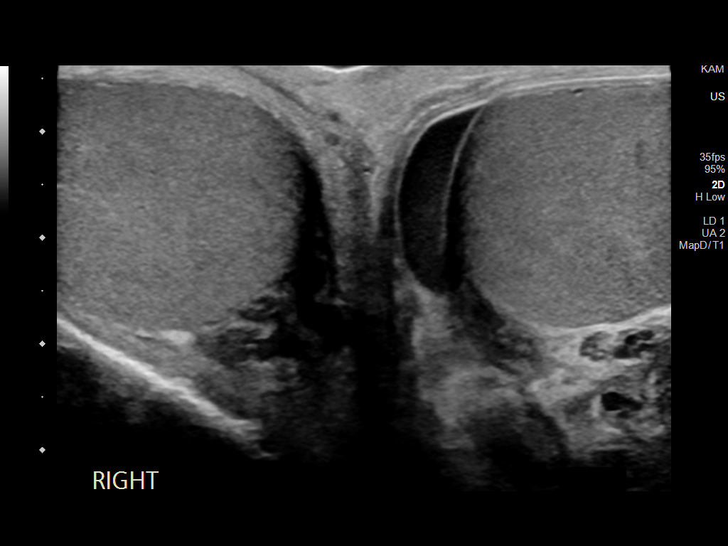
[im 12/140]
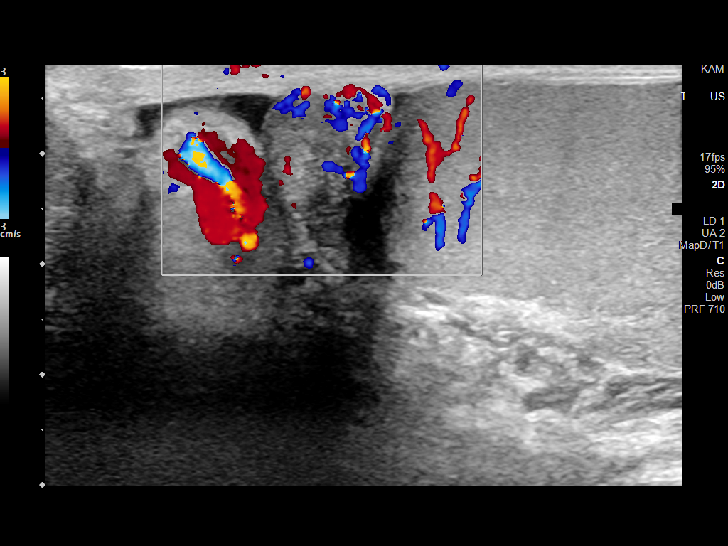
[im 24/140]
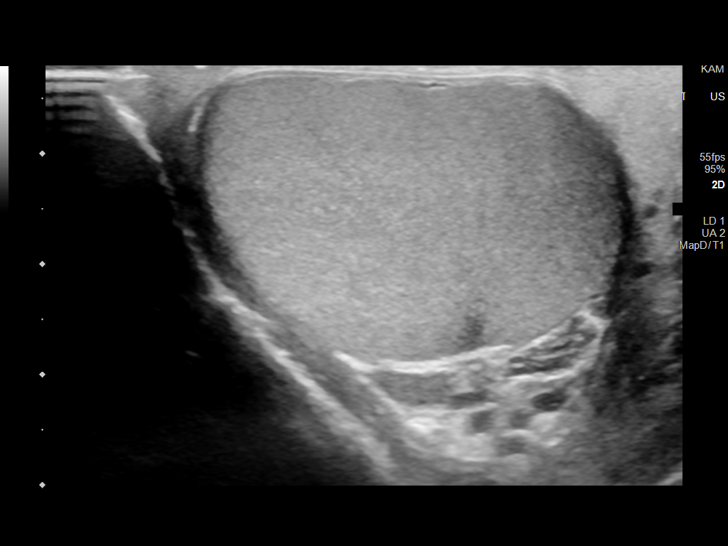
[im 35/140]
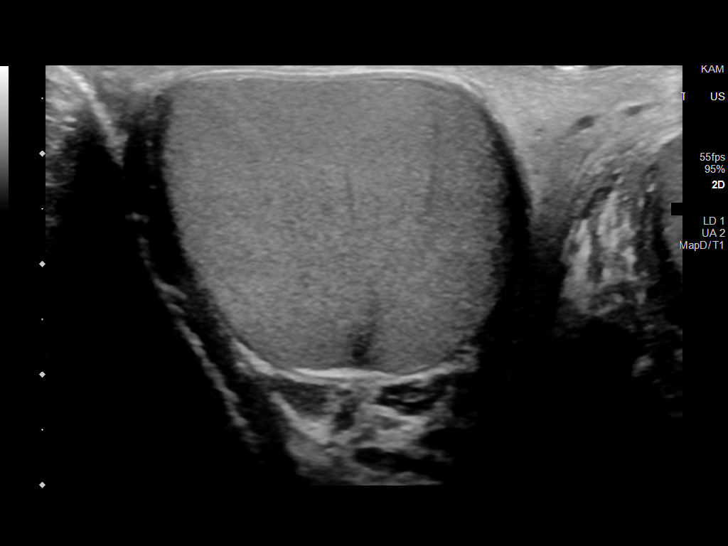
[im 47/140]
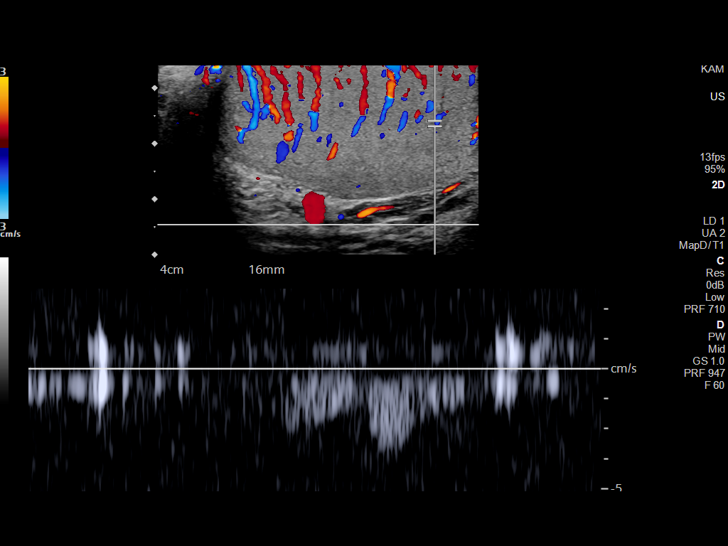
[im 58/140]
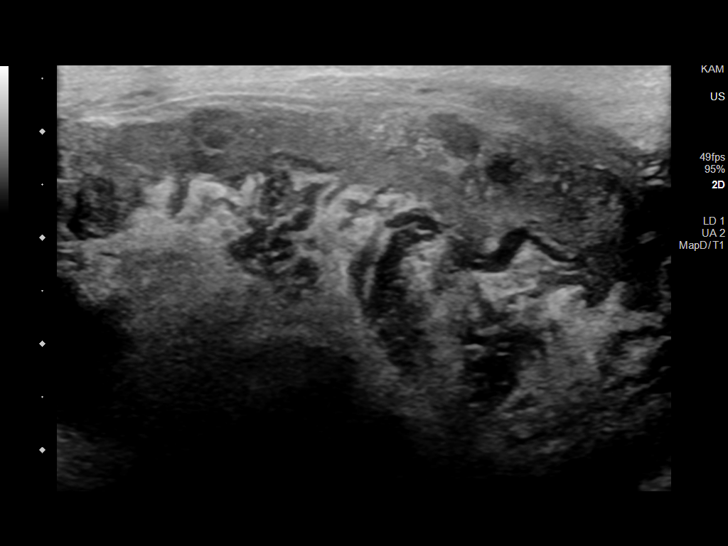
[im 70/140]
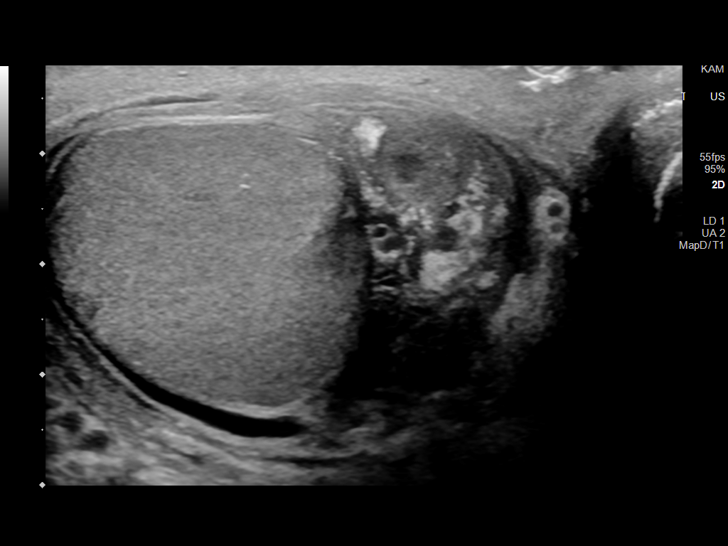
[im 82/140]
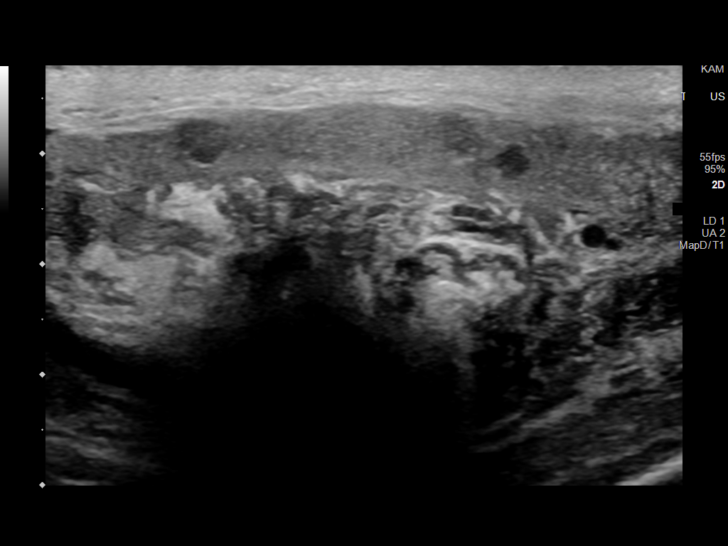
[im 93/140]
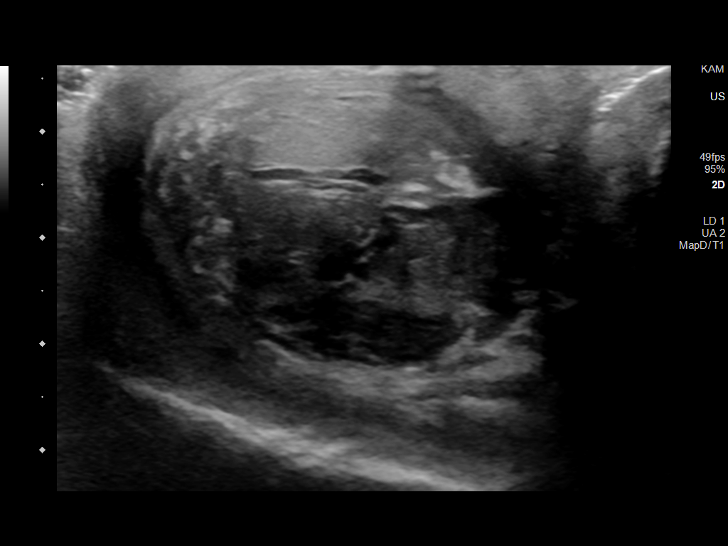
[im 105/140]
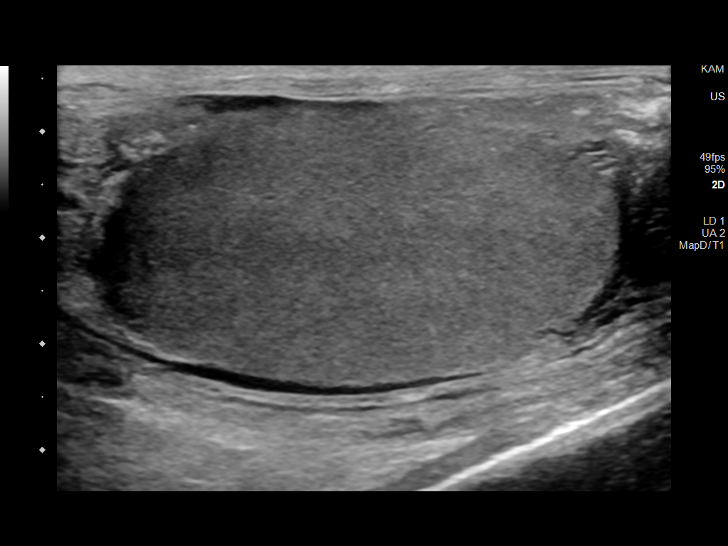
[im 116/140]
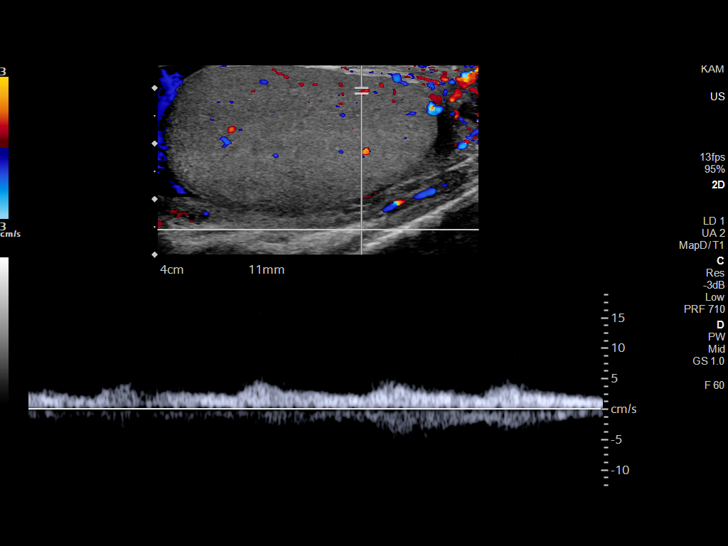
[im 128/140]
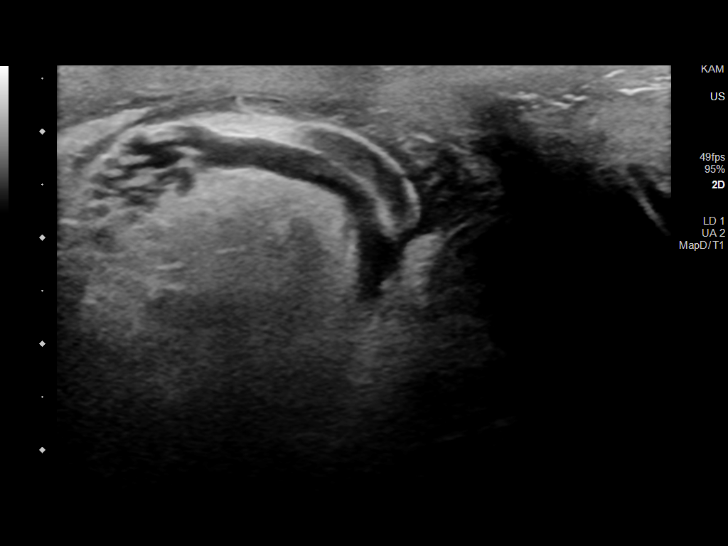
[im 140/140]
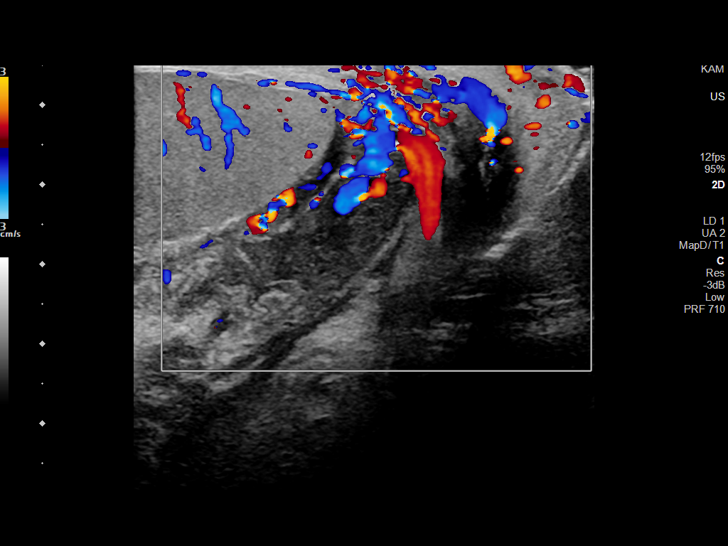

[13 of 25 positions shown; findings below may reference images not displayed]

FINDINGS: Right testicle

Measurements: 5.5 x 2.7 x 3.2 cm. No mass or microlithiasis
visualized.

Left testicle

Measurements: 4.9 x 2.8 x 3.0 cm. No mass or microlithiasis
visualized.

Right epididymis:  Normal in size and appearance.

Left epididymis: Enlarged, heterogeneous and hyperemic left
epididymis.

Hydrocele:  Small bilateral hydroceles.

Varicocele:  Small left varicocele, vessels measuring up to 0.3 cm.

Pulsed Doppler interrogation of both testes demonstrates hyperemic
flow to the right testicle. Normal arterial and venous Doppler flow
present in the left testicle.
IMPRESSION: 1. Enlarged, heterogeneous and hyperemic left epididymis, consistent
with epididymitis.
2. Hyperemia to the right testicle, which may reflect orchitis.
3. Small bilateral hydroceles, likely reactive.
4. Small left varicocele.

## 2023-12-09 ENCOUNTER — Emergency Department (HOSPITAL_COMMUNITY)

## 2023-12-09 ENCOUNTER — Emergency Department (HOSPITAL_COMMUNITY)
Admission: EM | Admit: 2023-12-09 | Discharge: 2023-12-09 | Disposition: A | Discharge status: Home / Self Care | Attending: Emergency Medicine | Admitting: Emergency Medicine

## 2023-12-09 DIAGNOSIS — R079 Chest pain, unspecified: Secondary | ICD-10-CM

## 2023-12-09 DIAGNOSIS — F1010 Alcohol abuse, uncomplicated: Secondary | ICD-10-CM | POA: Diagnosis not present

## 2023-12-09 DIAGNOSIS — K2100 Gastro-esophageal reflux disease with esophagitis, without bleeding: Secondary | ICD-10-CM | POA: Insufficient documentation

## 2023-12-09 DIAGNOSIS — F1090 Alcohol use, unspecified, uncomplicated: Secondary | ICD-10-CM

## 2023-12-09 DIAGNOSIS — R0789 Other chest pain: Secondary | ICD-10-CM | POA: Diagnosis present

## 2023-12-09 LAB — COMPREHENSIVE METABOLIC PANEL WITH GFR
ALT: 43 U/L (ref 0–44)
AST: 45 U/L — ABNORMAL HIGH (ref 15–41)
Albumin: 3.7 g/dL (ref 3.5–5.0)
Alkaline Phosphatase: 73 U/L (ref 38–126)
Anion gap: 13 (ref 5–15)
BUN: 10 mg/dL (ref 6–20)
CO2: 21 mmol/L — ABNORMAL LOW (ref 22–32)
Calcium: 8.3 mg/dL — ABNORMAL LOW (ref 8.9–10.3)
Chloride: 99 mmol/L (ref 98–111)
Creatinine, Ser: 0.54 mg/dL — ABNORMAL LOW (ref 0.61–1.24)
GFR, Estimated: >60 mL/min (ref >60)
Glucose, Bld: 101 mg/dL — ABNORMAL HIGH (ref 70–99)
Potassium: 3.3 mmol/L — ABNORMAL LOW (ref 3.5–5.1)
Sodium: 133 mmol/L — ABNORMAL LOW (ref 135–145)
Total Bilirubin: 1 mg/dL (ref 0.0–1.2)
Total Protein: 7 g/dL (ref 6.5–8.1)

## 2023-12-09 LAB — CBC WITH DIFFERENTIAL/PLATELET
Abs Immature Granulocytes: 0.06 10*3/uL (ref 0.00–0.07)
Basophils Absolute: 0.1 10*3/uL (ref 0.0–0.1)
Basophils Relative: 0 %
Eosinophils Absolute: 0 10*3/uL (ref 0.0–0.5)
Eosinophils Relative: 0 %
HCT: 45.5 % (ref 39.0–52.0)
Hemoglobin: 15.9 g/dL (ref 13.0–17.0)
Immature Granulocytes: 1 %
Lymphocytes Relative: 18 %
Lymphs Abs: 2.2 10*3/uL (ref 0.7–4.0)
MCH: 30.8 pg (ref 26.0–34.0)
MCHC: 34.9 g/dL (ref 30.0–36.0)
MCV: 88 fL (ref 80.0–100.0)
Monocytes Absolute: 0.7 10*3/uL (ref 0.1–1.0)
Monocytes Relative: 6 %
Neutro Abs: 9.3 10*3/uL — ABNORMAL HIGH (ref 1.7–7.7)
Neutrophils Relative %: 75 %
Platelets: 206 10*3/uL (ref 150–400)
RBC: 5.17 MIL/uL (ref 4.22–5.81)
RDW: 12.6 % (ref 11.5–15.5)
WBC: 12.4 10*3/uL — ABNORMAL HIGH (ref 4.0–10.5)
nRBC: 0 % (ref 0.0–0.2)

## 2023-12-09 LAB — TROPONIN I (HIGH SENSITIVITY): Troponin I (High Sensitivity): <2 ng/L (ref <18)

## 2023-12-09 LAB — LIPASE, BLOOD: Lipase: 43 U/L (ref 11–51)

## 2023-12-09 MED ORDER — FAMOTIDINE 20 MG PO TABS: 20.0000 mg | ORAL_TABLET | Freq: Two times a day (BID) | ORAL | 0 refills | Status: DC

## 2023-12-09 MED ORDER — ONDANSETRON HCL 4 MG/2ML IJ SOLN
4.0000 mg | Freq: Once | INTRAMUSCULAR | Status: AC
  Administered 2023-12-09: 4 mg via INTRAVENOUS
  Filled 2023-12-09: qty 2

## 2023-12-09 MED ORDER — POTASSIUM CHLORIDE CRYS ER 20 MEQ PO TBCR
40.0000 meq | EXTENDED_RELEASE_TABLET | Freq: Once | ORAL | Status: AC
  Administered 2023-12-09: 40 meq via ORAL
  Filled 2023-12-09: qty 2

## 2023-12-09 MED ORDER — FAMOTIDINE IN NACL 20-0.9 MG/50ML-% IV SOLN
20.0000 mg | Freq: Once | INTRAVENOUS | Status: AC
  Administered 2023-12-09: 20 mg via INTRAVENOUS
  Filled 2023-12-09: qty 50

## 2023-12-09 MED ORDER — CHLORDIAZEPOXIDE HCL 25 MG PO CAPS: ORAL_CAPSULE | ORAL | 0 refills | Status: DC

## 2023-12-09 MED ORDER — ONDANSETRON 4 MG PO TBDP: 4.0000 mg | ORAL_TABLET | Freq: Three times a day (TID) | ORAL | 0 refills | Status: DC | PRN

## 2023-12-09 MED ORDER — LORAZEPAM 2 MG/ML IJ SOLN
2.0000 mg | Freq: Once | INTRAMUSCULAR | Status: AC
  Administered 2023-12-09: 2 mg via INTRAVENOUS
  Filled 2023-12-09: qty 1

## 2023-12-09 NOTE — ED Triage Notes (Signed)
 Multiple complaints-Hot/cold flashes, pit in the top of his stomach, SHOB, needs to talk about alcohol abuse and mental health stuff. Endorses drinking about 6 beers per day.

## 2023-12-09 NOTE — ED Notes (Signed)
 Patient report taking Zoloft x 2 weeks and Cymbalta x 1 week and did not feel any better made him feel cloudy. Endorses multiple recent stressors.

## 2023-12-09 NOTE — Discharge Instructions (Addendum)
 You were seen for your reflux in the emergency department.   At home, please take the Pepcid  we prescribed you to treat your reflux.  Take Zofran  for any nausea or vomiting.  Take the Librium for alcohol withdrawals.    Check your MyChart online for the results of any tests that had not resulted by the time you left the emergency department.   Follow-up with your primary doctor in 2-3 days regarding your visit.  Follow-up with behavioral health urgent care about your anxiety and alcohol use.  Return immediately to the emergency department if you experience any of the following: Seizures, worsening chest pain, difficulty breathing, or any other concerning symptoms.    Thank you for visiting our Emergency Department. It was a pleasure taking care of you today.

## 2023-12-09 NOTE — ED Provider Notes (Signed)
 [place] EMERGENCY DEPARTMENT AT [facility] Provider Note   CSN: [id number] Arrival date & time: 12/09/23  [id number]     Patient presents with: Dizziness (SHOB, Hot/Cold flashes)   Terry Simmons is a 33 y.o. male.   33 year old male with a history of GERD and alcohol abuse who presents emergency department chest discomfort.  Patient reports that he has been having lower chest discomfort since last night.  Also has been having nausea and vomiting and diarrhea.  It has been nonbloody.  Says that he drinks 6 beers per day.  Last drink was yesterday.  Has been having's mild tremors.  No history of alcohol withdrawal.  Does wish to stop drinking at this time.       Prior to Admission medications   Medication Sig Start Date End Date Taking? Authorizing Provider  acetaminophen  (TYLENOL ) 325 MG tablet Take 2 tablets (650 mg total) by mouth every 6 (six) hours as needed for up to 30 doses for mild pain or moderate pain. 10/13/20   Cottie Donnice PARAS, MD  chlordiazePOXIDE (LIBRIUM) 25 MG capsule 50mg  PO TID x 1D, then 25-50mg  PO BID X 1D, then 25-50mg  PO QD X 1D 12/09/23   Yolande Lamar BROCKS, MD  famotidine  (PEPCID ) 20 MG tablet Take 1 tablet (20 mg total) by mouth 2 (two) times daily. 12/09/23   Yolande Lamar BROCKS, MD  HYDROcodone -acetaminophen  (NORCO) 5-325 MG per tablet Take 1-2 tablets by mouth every 4 (four) hours as needed. 11/11/13   Tonia Chew, MD  ondansetron  (ZOFRAN -ODT) 4 MG disintegrating tablet Take 1 tablet (4 mg total) by mouth every 8 (eight) hours as needed for nausea or vomiting. 12/09/23   Yolande Lamar BROCKS, MD  penicillin v potassium (VEETID) 500 MG tablet Take 500 mg by mouth 4 (four) times daily.    [provider]    Allergies: Strawberry (diagnostic)    Review of Systems  Updated Vital Signs BP (!) 152/94   Pulse 86   Temp 99 F (37.2 C) (Oral)   Resp 13   Ht 5' 11 (1.803 m)   Wt 106.6 kg   SpO2 96%   BMI 32.78 kg/m   Physical  Exam Vitals and nursing note reviewed.  Constitutional:      General: He is not in acute distress.    Appearance: He is well-developed.  HENT:     Head: Normocephalic and atraumatic.     Right Ear: External ear normal.     Left Ear: External ear normal.     Nose: Nose normal.   Eyes:     Extraocular Movements: Extraocular movements intact.     Conjunctiva/sclera: Conjunctivae normal.     Pupils: Pupils are equal, round, and reactive to light.    Cardiovascular:     Rate and Rhythm: Normal rate and regular rhythm.     Heart sounds: Normal heart sounds.  Pulmonary:     Effort: Pulmonary effort is normal. No respiratory distress.     Breath sounds: Normal breath sounds.  Abdominal:     General: There is no distension.     Palpations: Abdomen is soft. There is no mass.     Tenderness: There is abdominal tenderness (Epigastrium). There is no guarding.   Musculoskeletal:     Cervical back: Normal range of motion and neck supple.     Right lower leg: No edema.     Left lower leg: No edema.   Skin:    General: Skin is warm  and dry.   Neurological:     Mental Status: He is alert. Mental status is at baseline.     Comments: Mild tongue fasciculation and tremor  Psychiatric:        Mood and Affect: Mood normal.        Behavior: Behavior normal.     (all labs ordered are listed, but only abnormal results are displayed) Labs Reviewed  CBC WITH DIFFERENTIAL/PLATELET - Abnormal; Notable for the following components:      Result Value   WBC 12.4 (*)    Neutro Abs 9.3 (*)    All other components within normal limits  COMPREHENSIVE METABOLIC PANEL WITH GFR - Abnormal; Notable for the following components:   Sodium 133 (*)    Potassium 3.3 (*)    CO2 21 (*)    Glucose, Bld 101 (*)    Creatinine, Ser 0.54 (*)    Calcium 8.3 (*)    AST 45 (*)    All other components within normal limits  LIPASE, BLOOD  TROPONIN I (HIGH SENSITIVITY)    EKG: EKG  Interpretation Date/Time:  Saturday December 09 2023 10:15:23 EDT Ventricular Rate:  83 PR Interval:  139 QRS Duration:  94 QT Interval:  359 QTC Calculation: 422 R Axis:   40  Text Interpretation: Sinus rhythm Confirmed by Yolande Charleston [phone]) on 12/09/2023 10:18:09 AM  Radiology: DG Chest 2 View Result Date: 12/09/2023 EXAM: 2 VIEW(S) XRAY OF THE CHEST 12/09/2023 08:35:50 AM COMPARISON: None available. CLINICAL HISTORY: Chest pain. FINDINGS: LUNGS AND PLEURA: No focal pulmonary opacity. No pulmonary edema. No pleural effusion. No pneumothorax. HEART AND MEDIASTINUM: No acute abnormality of the cardiac and mediastinal silhouettes. BONES AND SOFT TISSUES: No acute osseous abnormality. IMPRESSION: 1. No acute process. Electronically signed by: Lonni Necessary MD 12/09/2023 08:39 AM EDT RP Workstation: [id number]     Procedures   Medications Ordered in the ED  ondansetron  (ZOFRAN ) injection 4 mg (4 mg Intravenous Given 12/09/23 0837)  famotidine  (PEPCID ) IVPB 20 mg premix (0 mg Intravenous Stopped 12/09/23 0950)  LORazepam  (ATIVAN ) injection 2 mg (2 mg Intravenous Given 12/09/23 0837)  potassium chloride  SA (KLOR-CON  M) CR tablet 40 mEq (40 mEq Oral Given 12/09/23 1009)                                    Medical Decision Making Amount and/or Complexity of Data Reviewed Labs: ordered. Radiology: ordered.  Risk Prescription drug management.   33 year old male with history of GERD and alcohol abuse who presents emergency department chest discomfort, tremors, nausea and vomiting and diarrhea  Initial Ddx:  Gastroenteritis, alcohol withdrawal, pancreatitis, anxiety, electrolyte abnormality  MDM/Course:  Patient presents to the emergency department with tremors, nausea, vomiting, diarrhea, and chest discomfort.  Does appear somewhat anxious and tremulous on exam.  Is having epigastric tenderness to palpation.  Suspect that he may be having gastroenteritis with superimposed alcohol  withdrawals.  Labs were obtained including a lipase which were overall reassuring.  Did have an initial high-sensitivity troponin was undetectably low with a reassuring EKG.  Do not feel that he needs a repeat troponin given the duration of his symptoms.  He was given Ativan , Zofran , Pepcid , and potassium and upon re-evaluation was feeling much improved.  Discharged home with Librium taper and Zofran  to take.  Will have him follow-up with behavioral health urgent care for his anxiety as well.  This patient presents to  the ED for concern of complaints listed in HPI, this involves an extensive number of treatment options, and is a complaint that carries with it a high risk of complications and morbidity. Disposition including potential need for admission considered.   Dispo: DC Home. Return precautions discussed including, but not limited to, those listed in the AVS. Allowed pt time to ask questions which were answered fully prior to dc.  Records reviewed Outpatient Clinic Notes The following labs were independently interpreted: Chemistry and show no acute abnormality I independently reviewed the following imaging with scope of interpretation limited to determining acute life threatening conditions related to emergency care: Chest x-ray and agree with the radiologist interpretation with the following exceptions: none I personally reviewed and interpreted cardiac monitoring: normal sinus rhythm  I personally reviewed and interpreted the pt's EKG: see above for interpretation  I have reviewed the patients home medications and made adjustments as needed Social Determinants of health:  Alcohol abuse  Portions of this note were generated with Scientist, clinical (histocompatibility and immunogenetics). Dictation errors may occur despite best attempts at proofreading.     Final diagnoses:  Chest pain, unspecified type  Gastroesophageal reflux disease with esophagitis without hemorrhage  Alcohol use    ED Discharge Orders           Ordered    famotidine  (PEPCID ) 20 MG tablet  2 times daily,   Status:  Discontinued        12/09/23 1017    ondansetron  (ZOFRAN -ODT) 4 MG disintegrating tablet  Every 8 hours PRN,   Status:  Discontinued        12/09/23 1017    chlordiazePOXIDE (LIBRIUM) 25 MG capsule  Status:  Discontinued        12/09/23 1017    chlordiazePOXIDE (LIBRIUM) 25 MG capsule        12/09/23 1050    famotidine  (PEPCID ) 20 MG tablet  2 times daily        12/09/23 1050    ondansetron  (ZOFRAN -ODT) 4 MG disintegrating tablet  Every 8 hours PRN        12/09/23 1050               Yolande Lamar BROCKS, MD 12/09/23 1958

## 2024-02-27 ENCOUNTER — Encounter (HOSPITAL_COMMUNITY): Payer: Self-pay

## 2024-02-27 ENCOUNTER — Inpatient Hospital Stay (HOSPITAL_COMMUNITY)
Admission: EM | Admit: 2024-02-27 | Discharge: 2024-03-01 | DRG: 897 | Disposition: A | Discharge status: Home / Self Care | Payer: MEDICAID | Attending: Internal Medicine | Admitting: Internal Medicine

## 2024-02-27 ENCOUNTER — Emergency Department (HOSPITAL_COMMUNITY): Payer: MEDICAID

## 2024-02-27 ENCOUNTER — Other Ambulatory Visit: Payer: Self-pay

## 2024-02-27 DIAGNOSIS — Z87891 Personal history of nicotine dependence: Secondary | ICD-10-CM

## 2024-02-27 DIAGNOSIS — E871 Hypo-osmolality and hyponatremia: Secondary | ICD-10-CM | POA: Diagnosis not present

## 2024-02-27 DIAGNOSIS — R748 Abnormal levels of other serum enzymes: Secondary | ICD-10-CM | POA: Diagnosis not present

## 2024-02-27 DIAGNOSIS — Y908 Blood alcohol level of 240 mg/100 ml or more: Secondary | ICD-10-CM | POA: Diagnosis present

## 2024-02-27 DIAGNOSIS — K219 Gastro-esophageal reflux disease without esophagitis: Secondary | ICD-10-CM | POA: Diagnosis present

## 2024-02-27 DIAGNOSIS — E66811 Obesity, class 1: Secondary | ICD-10-CM | POA: Diagnosis present

## 2024-02-27 DIAGNOSIS — F10920 Alcohol use, unspecified with intoxication, uncomplicated: Principal | ICD-10-CM

## 2024-02-27 DIAGNOSIS — Z87892 Personal history of anaphylaxis: Secondary | ICD-10-CM

## 2024-02-27 DIAGNOSIS — R6510 Systemic inflammatory response syndrome (SIRS) of non-infectious origin without acute organ dysfunction: Secondary | ICD-10-CM | POA: Diagnosis not present

## 2024-02-27 DIAGNOSIS — Z6832 Body mass index (BMI) 32.0-32.9, adult: Secondary | ICD-10-CM

## 2024-02-27 DIAGNOSIS — J45909 Unspecified asthma, uncomplicated: Secondary | ICD-10-CM | POA: Diagnosis present

## 2024-02-27 DIAGNOSIS — E876 Hypokalemia: Secondary | ICD-10-CM | POA: Diagnosis present

## 2024-02-27 DIAGNOSIS — R1310 Dysphagia, unspecified: Secondary | ICD-10-CM | POA: Diagnosis present

## 2024-02-27 DIAGNOSIS — D72829 Elevated white blood cell count, unspecified: Secondary | ICD-10-CM

## 2024-02-27 DIAGNOSIS — K2970 Gastritis, unspecified, without bleeding: Secondary | ICD-10-CM | POA: Diagnosis present

## 2024-02-27 DIAGNOSIS — K529 Noninfective gastroenteritis and colitis, unspecified: Secondary | ICD-10-CM | POA: Diagnosis present

## 2024-02-27 DIAGNOSIS — Z91018 Allergy to other foods: Secondary | ICD-10-CM

## 2024-02-27 DIAGNOSIS — F10139 Alcohol abuse with withdrawal, unspecified: Principal | ICD-10-CM | POA: Diagnosis present

## 2024-02-27 DIAGNOSIS — F10129 Alcohol abuse with intoxication, unspecified: Secondary | ICD-10-CM | POA: Diagnosis present

## 2024-02-27 DIAGNOSIS — E872 Acidosis, unspecified: Secondary | ICD-10-CM | POA: Diagnosis present

## 2024-02-27 DIAGNOSIS — E860 Dehydration: Secondary | ICD-10-CM | POA: Diagnosis present

## 2024-02-27 DIAGNOSIS — F10929 Alcohol use, unspecified with intoxication, unspecified: Secondary | ICD-10-CM | POA: Diagnosis present

## 2024-02-27 LAB — RAPID URINE DRUG SCREEN, HOSP PERFORMED
Amphetamines: NOT DETECTED
Barbiturates: NOT DETECTED
Benzodiazepines: NOT DETECTED
Cocaine: NOT DETECTED
Opiates: NOT DETECTED
Tetrahydrocannabinol: NOT DETECTED

## 2024-02-27 LAB — COMPREHENSIVE METABOLIC PANEL WITH GFR
ALT: 128 U/L — ABNORMAL HIGH (ref 0–44)
AST: 139 U/L — ABNORMAL HIGH (ref 15–41)
Albumin: 2.8 g/dL — ABNORMAL LOW (ref 3.5–5.0)
Alkaline Phosphatase: 75 U/L (ref 38–126)
Anion gap: 18 — ABNORMAL HIGH (ref 5–15)
BUN: 16 mg/dL (ref 6–20)
CO2: 19 mmol/L — ABNORMAL LOW (ref 22–32)
Calcium: 7.2 mg/dL — ABNORMAL LOW (ref 8.9–10.3)
Chloride: 85 mmol/L — ABNORMAL LOW (ref 98–111)
Creatinine, Ser: 0.59 mg/dL — ABNORMAL LOW (ref 0.61–1.24)
GFR, Estimated: >60 mL/min (ref >60)
Glucose, Bld: 157 mg/dL — ABNORMAL HIGH (ref 70–99)
Potassium: 3.8 mmol/L (ref 3.5–5.1)
Sodium: 122 mmol/L — ABNORMAL LOW (ref 135–145)
Total Bilirubin: 1.1 mg/dL (ref 0.0–1.2)
Total Protein: 5.4 g/dL — ABNORMAL LOW (ref 6.5–8.1)

## 2024-02-27 LAB — CBC WITH DIFFERENTIAL/PLATELET
Abs Immature Granulocytes: 0.21 K/uL — ABNORMAL HIGH (ref 0.00–0.07)
Basophils Absolute: 0.1 K/uL (ref 0.0–0.1)
Basophils Relative: 0 %
Eosinophils Absolute: 0 K/uL (ref 0.0–0.5)
Eosinophils Relative: 0 %
HCT: 47.2 % (ref 39.0–52.0)
Hemoglobin: 17.3 g/dL — ABNORMAL HIGH (ref 13.0–17.0)
Immature Granulocytes: 1 %
Lymphocytes Relative: 16 %
Lymphs Abs: 3.7 K/uL (ref 0.7–4.0)
MCH: 29.9 pg (ref 26.0–34.0)
MCHC: 36.7 g/dL — ABNORMAL HIGH (ref 30.0–36.0)
MCV: 81.5 fL (ref 80.0–100.0)
Monocytes Absolute: 1.4 K/uL — ABNORMAL HIGH (ref 0.1–1.0)
Monocytes Relative: 6 %
Neutro Abs: 18.3 K/uL — ABNORMAL HIGH (ref 1.7–7.7)
Neutrophils Relative %: 77 %
Platelets: 197 K/uL (ref 150–400)
RBC: 5.79 MIL/uL (ref 4.22–5.81)
RDW: 12.1 % (ref 11.5–15.5)
WBC: 23.7 K/uL — ABNORMAL HIGH (ref 4.0–10.5)
nRBC: 0 % (ref 0.0–0.2)

## 2024-02-27 LAB — URINALYSIS, ROUTINE W REFLEX MICROSCOPIC
Bilirubin Urine: NEGATIVE
Glucose, UA: NEGATIVE mg/dL
Hgb urine dipstick: NEGATIVE
Ketones, ur: NEGATIVE mg/dL
Leukocytes,Ua: NEGATIVE
Nitrite: NEGATIVE
Protein, ur: NEGATIVE mg/dL
Specific Gravity, Urine: 1.01 (ref 1.005–1.030)
pH: 6 (ref 5.0–8.0)

## 2024-02-27 LAB — MAGNESIUM: Magnesium: 1.9 mg/dL (ref 1.7–2.4)

## 2024-02-27 LAB — ETHANOL: Alcohol, Ethyl (B): 333 mg/dL (ref <15)

## 2024-02-27 LAB — MRSA NEXT GEN BY PCR, NASAL: MRSA by PCR Next Gen: NOT DETECTED

## 2024-02-27 LAB — LACTIC ACID, PLASMA
Lactic Acid, Venous: 2.7 mmol/L (ref 0.5–1.9)
Lactic Acid, Venous: 2.3 mmol/L (ref 0.5–1.9)

## 2024-02-27 LAB — LIPASE, BLOOD: Lipase: 61 U/L — ABNORMAL HIGH (ref 11–51)

## 2024-02-27 MED ORDER — ADULT MULTIVITAMIN W/MINERALS CH
1.0000 | ORAL_TABLET | Freq: Every day | ORAL | Status: DC
  Administered 2024-02-27: 1 via ORAL
  Filled 2024-02-27: qty 1

## 2024-02-27 MED ORDER — ADULT MULTIVITAMIN W/MINERALS CH: 1.0000 | ORAL_TABLET | Freq: Every day | ORAL | Status: DC

## 2024-02-27 MED ORDER — ONDANSETRON HCL 4 MG/2ML IJ SOLN
4.0000 mg | Freq: Four times a day (QID) | INTRAMUSCULAR | Status: DC | PRN
  Administered 2024-02-29 – 2024-03-01 (×2): 4 mg via INTRAVENOUS
  Filled 2024-02-27 (×2): qty 2

## 2024-02-27 MED ORDER — LORAZEPAM 2 MG/ML IJ SOLN
0.0000 mg | Freq: Four times a day (QID) | INTRAMUSCULAR | Status: AC
  Administered 2024-02-27 – 2024-02-28 (×2): 2 mg via INTRAVENOUS
  Administered 2024-02-28 (×2): 1 mg via INTRAVENOUS
  Administered 2024-02-28: 2 mg via INTRAVENOUS
  Administered 2024-02-29: 1 mg via INTRAVENOUS
  Filled 2024-02-27 (×6): qty 1

## 2024-02-27 MED ORDER — SODIUM CHLORIDE 0.9 % IV BOLUS
500.0000 mL | Freq: Once | INTRAVENOUS | Status: AC
  Administered 2024-02-27: 500 mL via INTRAVENOUS

## 2024-02-27 MED ORDER — FOLIC ACID 1 MG PO TABS
1.0000 mg | ORAL_TABLET | Freq: Every day | ORAL | Status: DC
  Administered 2024-02-27 – 2024-03-01 (×4): 1 mg via ORAL
  Filled 2024-02-27 (×5): qty 1

## 2024-02-27 MED ORDER — THIAMINE MONONITRATE 100 MG PO TABS
100.0000 mg | ORAL_TABLET | Freq: Every day | ORAL | Status: DC
  Administered 2024-02-28 – 2024-03-01 (×3): 100 mg via ORAL
  Filled 2024-02-27 (×3): qty 1

## 2024-02-27 MED ORDER — ENOXAPARIN SODIUM 60 MG/0.6ML IJ SOSY
50.0000 mg | PREFILLED_SYRINGE | INTRAMUSCULAR | Status: DC
  Administered 2024-02-27 – 2024-02-29 (×3): 50 mg via SUBCUTANEOUS
  Filled 2024-02-27 (×3): qty 0.6

## 2024-02-27 MED ORDER — SODIUM CHLORIDE 0.9 % IV SOLN: Freq: Once | INTRAVENOUS | Status: AC

## 2024-02-27 MED ORDER — LORAZEPAM 2 MG/ML IJ SOLN
1.0000 mg | INTRAMUSCULAR | Status: DC | PRN
  Administered 2024-02-29: 2 mg via INTRAVENOUS
  Filled 2024-02-27: qty 1

## 2024-02-27 MED ORDER — LORAZEPAM 2 MG/ML IJ SOLN: 1.0000 mg | INTRAMUSCULAR | Status: DC | PRN

## 2024-02-27 MED ORDER — THIAMINE HCL 100 MG/ML IJ SOLN
100.0000 mg | Freq: Every day | INTRAMUSCULAR | Status: DC
Start: 2024-02-28 — End: 2024-03-01
  Filled 2024-02-27: qty 2

## 2024-02-27 MED ORDER — ACETAMINOPHEN 650 MG RE SUPP: 650.0000 mg | Freq: Four times a day (QID) | RECTAL | Status: DC | PRN

## 2024-02-27 MED ORDER — SODIUM CHLORIDE 0.9 % IV BOLUS
1000.0000 mL | Freq: Once | INTRAVENOUS | Status: AC
  Administered 2024-02-27: 1000 mL via INTRAVENOUS

## 2024-02-27 MED ORDER — CHLORDIAZEPOXIDE HCL 25 MG PO CAPS
25.0000 mg | ORAL_CAPSULE | Freq: Once | ORAL | Status: AC
  Administered 2024-02-27: 25 mg via ORAL
  Filled 2024-02-27: qty 1

## 2024-02-27 MED ORDER — ACETAMINOPHEN 325 MG PO TABS
650.0000 mg | ORAL_TABLET | Freq: Four times a day (QID) | ORAL | Status: DC | PRN
  Administered 2024-02-28 – 2024-02-29 (×2): 650 mg via ORAL
  Filled 2024-02-27 (×2): qty 2

## 2024-02-27 MED ORDER — THIAMINE HCL 100 MG/ML IJ SOLN
100.0000 mg | Freq: Once | INTRAMUSCULAR | Status: AC
  Administered 2024-02-27: 100 mg via INTRAVENOUS
  Filled 2024-02-27: qty 2

## 2024-02-27 MED ORDER — PANTOPRAZOLE SODIUM 40 MG IV SOLR
40.0000 mg | Freq: Once | INTRAVENOUS | Status: AC
  Administered 2024-02-27: 40 mg via INTRAVENOUS
  Filled 2024-02-27: qty 10

## 2024-02-27 MED ORDER — ONDANSETRON HCL 4 MG PO TABS: 4.0000 mg | ORAL_TABLET | Freq: Four times a day (QID) | ORAL | Status: DC | PRN

## 2024-02-27 MED ORDER — POLYETHYLENE GLYCOL 3350 17 G PO PACK: 17.0000 g | PACK | Freq: Every day | ORAL | Status: DC | PRN

## 2024-02-27 MED ORDER — IOHEXOL 300 MG/ML  SOLN
100.0000 mL | Freq: Once | INTRAMUSCULAR | Status: AC | PRN
  Administered 2024-02-27: 100 mL via INTRAVENOUS

## 2024-02-27 MED ORDER — LORAZEPAM 1 MG PO TABS
1.0000 mg | ORAL_TABLET | ORAL | Status: DC | PRN
  Administered 2024-02-27: 1 mg via ORAL
  Administered 2024-02-27: 2 mg via ORAL
  Filled 2024-02-27: qty 2
  Filled 2024-02-27: qty 1

## 2024-02-27 MED ORDER — LORAZEPAM 2 MG/ML IJ SOLN
0.0000 mg | Freq: Two times a day (BID) | INTRAMUSCULAR | Status: DC
  Administered 2024-03-01: 1 mg via INTRAVENOUS
  Filled 2024-02-27: qty 1

## 2024-02-27 MED ORDER — PIPERACILLIN-TAZOBACTAM 3.375 G IVPB 30 MIN
3.3750 g | Freq: Once | INTRAVENOUS | Status: AC
  Administered 2024-02-27: 3.375 g via INTRAVENOUS
  Filled 2024-02-27: qty 50

## 2024-02-27 MED ORDER — SODIUM CHLORIDE 0.9 % IV SOLN: INTRAVENOUS | Status: DC

## 2024-02-27 MED ORDER — LORAZEPAM 1 MG PO TABS
1.0000 mg | ORAL_TABLET | ORAL | Status: DC | PRN
  Administered 2024-02-29 – 2024-03-01 (×5): 1 mg via ORAL
  Filled 2024-02-27 (×5): qty 1

## 2024-02-27 MED ORDER — ONDANSETRON HCL 4 MG/2ML IJ SOLN
4.0000 mg | Freq: Once | INTRAMUSCULAR | Status: AC
  Administered 2024-02-27: 4 mg via INTRAVENOUS
  Filled 2024-02-27: qty 2

## 2024-02-27 NOTE — Assessment & Plan Note (Addendum)
 Sodium 122, baseline appears to be 133-138.  Likely from beer potomania, hydration from vomiting and diarrhea likely contributing. -1.5 L bolus given, continue N/s 75cc/hr x 1 day - Urine osmolality, serum osmolality -Urine sodium -TSH

## 2024-02-27 NOTE — ED Triage Notes (Addendum)
 Patient BIB [facility] from home per family patient has been drinking continuously for a week. Patient states he believes he is going through withdrawals.

## 2024-02-27 NOTE — ED Notes (Addendum)
 Transition of Care [facility]) - Emergency Department Mini Assessment   Patient Details  Name: Terry Simmons MRN: [id number] Date of Birth: [birth year]  Transition of Care [facility]) CM/SW Contact:    Noreen KATHEE Cleotilde ISRAEL Phone Number: 02/27/2024, 3:42 PM   Clinical Narrative:  CSW spoke with patient at bedside regarding consult for substance abuse counseling/ education. Patient was somewhat guarded with some of CSW questions such as how often he drinks and any stressors. Resources were added to AVS. ICM signing off.   ED Mini Assessment: What brought you to the Emergency Department? : intoxication  Barriers to Discharge: Continued Medical Work up  [employer] interventions: ED substance abuse resources needed  Means of departure: Not know  Interventions which prevented an admission or readmission: Other (must enter comment) (Substance resources)    Patient Contact and Communications Key Contact 1: Reyes Sermon with: Patient at bedside Contact Date: 02/27/24,   Contact time: 0200 Contact Phone Number: At bedside Call outcome: Substance resources  Patient states their goals for this hospitalization and ongoing recovery are:: DC home      Admission diagnosis:  EMS: intoxiacation There are no active problems to display for this patient.  PCP:  Chrystal Lamarr RAMAN, MD Pharmacy:   [facility] [phone] - [place], [place] - [address] [facility] [address] [place] [address] Phone: [phone] Fax: [phone]  [id number] - [place], [place] - [address] [place] [address] Phone: [phone] Fax: [phone]

## 2024-02-27 NOTE — ED Notes (Signed)
 Terry Simmons  (Mom)

## 2024-02-27 NOTE — Assessment & Plan Note (Addendum)
 Meeting criteria with tachycardia heart rate 86-114, leukocytosis of 23.7.  Tmax 99.2.  With lactic acid of 2.7 > 2.3.  CT suggest enteritis.  UA not suggestive of infection. - Obtain chest x-ray -Follow-up blood cultures -Hydrate -1.5 L bolus, continue hydration - Zosyn  given in ED, further antibiotics held for now.

## 2024-02-27 NOTE — ED Notes (Signed)
 ED Provider at bedside.

## 2024-02-27 NOTE — H&P (Signed)
 History and Physical    Divine Hansley FMW:[id number] DOB: [birth year] DOA: 02/27/2024  PCP: Chrystal Lamarr RAMAN, MD   Patient coming from: Home  I have personally briefly reviewed patient's old medical records in [facility]  Chief Complaint: Alcohol withdrawal  HPI: Terry Simmons is a 33 y.o. male with medical history significant for asthma, alcohol abuse. Patient was brought to the ED by EMS after family and patient called for alcohol withdrawal.  Patient reports his last drink of alcohol was this morning at about 10 AM, he tells me he started going through withdrawal last night.  He wants to quit drinking alcohol.  He tells me he drinks a lot of beer, he does not drink liquor, is not telling me how much alcohol he actually drinks.  He reports tremulousness, nausea and vomiting, and feeling like crap.  He denies hallucinations auditory or visual.  He also reports diarrhea without blood over the past few days.  No abdominal pain.  He reports he quit drinking for 60 days but resumed drinking 2 weeks ago. He reports history of the shakes when he does not drink, but no hospitalization for alcohol withdrawal, no seizures.  ED Course: Temperature 99.2.  Heart rate 86-114.  Respiratory rate 16-29.  Blood pressure systolic 107-130. Blood alcohol level 333. Sodium 122. Anion gap of 18, serum bicarb of 19.  Lactic acid of 2.7. AST 139, ALT 128. WBC 23.7. CT shows just nonspecific enteritis,  Fatty changes at the ileocecal junction could reflect sequela of prior inflammation. 1 L bolus given.  Zosyn  started.   Review of Systems: As per HPI all other systems reviewed and negative.  Past Medical History:  Diagnosis Date   Asthma    Renal disorder     History reviewed. No pertinent surgical history.   reports that he has quit smoking. His smoking use included cigarettes. He has never used smokeless tobacco. He reports current alcohol use. He reports current drug use.  Drug: Marijuana.  Allergies  Allergen Reactions   Strawberry (Diagnostic) Anaphylaxis    Allergy test done when he was a kid    Family history of hypertension.  Prior to Admission medications   Medication Sig Start Date End Date Taking? Authorizing Provider  acetaminophen  (TYLENOL ) 325 MG tablet Take 2 tablets (650 mg total) by mouth every 6 (six) hours as needed for up to 30 doses for mild pain or moderate pain. 10/13/20   Cottie Donnice PARAS, MD  busPIRone (BUSPAR) 10 MG tablet Take 10 mg by mouth 2 (two) times daily. 11/30/23   [provider]  chlordiazePOXIDE  (LIBRIUM ) 25 MG capsule 50mg  PO TID x 1D, then 25-50mg  PO BID X 1D, then 25-50mg  PO QD X 1D 12/09/23   Yolande Lamar BROCKS, MD  famotidine  (PEPCID ) 20 MG tablet Take 1 tablet (20 mg total) by mouth 2 (two) times daily. 12/09/23   Yolande Lamar BROCKS, MD  HYDROcodone -acetaminophen  (NORCO) 5-325 MG per tablet Take 1-2 tablets by mouth every 4 (four) hours as needed. 11/11/13   Tonia Chew, MD  methocarbamol (ROBAXIN) 500 MG tablet Take 500 mg by mouth 2 (two) times daily as needed. 01/01/24   [provider]  ondansetron  (ZOFRAN ) 4 MG tablet Take 4 mg by mouth every 8 (eight) hours as needed. 11/09/23   [provider]  ondansetron  (ZOFRAN -ODT) 4 MG disintegrating tablet Take 1 tablet (4 mg total) by mouth every 8 (eight) hours as needed for nausea or vomiting. 12/09/23   Yolande,  Lamar BROCKS, MD  penicillin v potassium (VEETID) 500 MG tablet Take 500 mg by mouth 4 (four) times daily.    [provider]  sertraline (ZOLOFT) 50 MG tablet Take 50 mg by mouth daily. 11/16/23   [provider]    Physical Exam: Vitals:   02/27/24 1557 02/27/24 1600 02/27/24 1615 02/27/24 1658  BP: 119/80 114/78 122/88   Pulse: 95 92 86 98  Resp:      Temp:      TempSrc:      SpO2: 93% 94% 92% 94%  Weight:      Height:        Constitutional: NAD, calm, comfortable Vitals:   02/27/24 1557 02/27/24 1600 02/27/24  1615 02/27/24 1658  BP: 119/80 114/78 122/88   Pulse: 95 92 86 98  Resp:      Temp:      TempSrc:      SpO2: 93% 94% 92% 94%  Weight:      Height:       Eyes: PERRL, lids and conjunctivae normal ENMT: Mucous membranes are moist.  Neck: normal, supple, no masses, no thyromegaly Respiratory: clear to auscultation bilaterally, no wheezing, no crackles. Normal respiratory effort. No accessory muscle use.  Cardiovascular: Regular rate and rhythm, no murmurs / rubs / gallops. No extremity edema.  Extremities warm. Abdomen: no tenderness, no masses palpated. No hepatosplenomegaly. Bowel sounds positive.  Musculoskeletal: no clubbing / cyanosis. No joint deformity upper and lower extremities.  Skin: no rashes, lesions, ulcers. No induration Neurologic: No facial asymmetry, moves extremities spontaneously, speech fluent.  Calm, no tremors at this time. Psychiatric: Normal judgment and insight. Alert and oriented x 3. Normal mood.   Labs on Admission: I have personally reviewed following labs and imaging studies  CBC: Recent Labs  Lab 02/27/24 1354  WBC 23.7*  NEUTROABS 18.3*  HGB 17.3*  HCT 47.2  MCV 81.5  PLT 197   Basic Metabolic Panel: Recent Labs  Lab 02/27/24 1354  NA 122*  K 3.8  CL 85*  CO2 19*  GLUCOSE 157*  BUN 16  CREATININE 0.59*  CALCIUM 7.2*   GFR: Estimated Creatinine Clearance: 168.3 mL/min (A) (by C-G formula based on SCr of 0.59 mg/dL (L)). Liver Function Tests: Recent Labs  Lab 02/27/24 1354  AST 139*  ALT 128*  ALKPHOS 75  BILITOT 1.1  PROT 5.4*  ALBUMIN 2.8*   Recent Labs  Lab 02/27/24 1354  LIPASE 61*   Urine analysis:    Component Value Date/Time   COLORURINE YELLOW 02/27/2024 1352   APPEARANCEUR CLEAR 02/27/2024 1352   LABSPEC 1.010 02/27/2024 1352   PHURINE 6.0 02/27/2024 1352   GLUCOSEU NEGATIVE 02/27/2024 1352   HGBUR NEGATIVE 02/27/2024 1352   BILIRUBINUR NEGATIVE 02/27/2024 1352   KETONESUR NEGATIVE 02/27/2024 1352    PROTEINUR NEGATIVE 02/27/2024 1352   NITRITE NEGATIVE 02/27/2024 1352   LEUKOCYTESUR NEGATIVE 02/27/2024 1352    Radiological Exams on Admission: CT ABDOMEN PELVIS W CONTRAST Result Date: 02/27/2024 CLINICAL DATA:  Epigastric pain. EXAM: CT ABDOMEN AND PELVIS WITH CONTRAST TECHNIQUE: Multidetector CT imaging of the abdomen and pelvis was performed using the standard protocol following bolus administration of intravenous contrast. RADIATION DOSE REDUCTION: This exam was performed according to the departmental dose-optimization program which includes automated exposure control, adjustment of the mA and/or kV according to patient size and/or use of iterative reconstruction technique. CONTRAST:  OMNIPAQUE  IOHEXOL  300 MG/ML  SOLN COMPARISON:  None Available. FINDINGS: Lower chest: Dependent changes at  the posterior lung bases. Hepatobiliary: No focal liver abnormality is seen. No gallstones, gallbladder wall thickening, or biliary dilatation. Pancreas: Unremarkable. No pancreatic ductal dilatation or surrounding inflammatory changes. Spleen: Normal in size without focal abnormality. Adrenals/Urinary Tract: Adrenal glands are unremarkable. Kidneys enhance symmetrically. No renal or ureteral calculi. No hydronephrosis. Bladder is unremarkable. Stomach/Bowel: Stomach is minimally distended and otherwise unremarkable. Fluid-filled loops of proximal to mid small bowel with wall thickening and mucosal hyperenhancement, suggestive of a nonspecific enteritis. No obstruction. Appendix is normal. Fatty changes at the ileocecal junction could reflect sequela of prior inflammation. Vascular/Lymphatic: Abdominal aorta is normal in caliber. No enlarged abdominal or pelvic lymph nodes. Reproductive: Unremarkable. Other: No abdominopelvic ascites. No intraperitoneal free air. No abdominal wall hernia. Musculoskeletal: No acute or significant osseous findings. IMPRESSION: 1. Fluid-filled loops of proximal to mid small bowel  with wall thickening and mucosal hyperenhancement, suggestive of a nonspecific enteritis. 2. Fatty changes at the ileocecal junction could reflect sequela of prior inflammation. Electronically Signed   By: Harrietta Sherry M.D.   On: 02/27/2024 16:05   EKG: None.   Assessment/Plan Principal Problem:   Hyponatremia Active Problems:   Elevated liver enzymes   Alcohol intoxication (HCC)   SIRS (systemic inflammatory response syndrome) (HCC)  Assessment and Plan: * Hyponatremia Sodium 122, baseline appears to be 133-138.  Likely from beer potomania, hydration from vomiting and diarrhea likely contributing. -1.5 L bolus given, continue N/s 75cc/hr x 1 day - Urine osmolality, serum osmolality -Urine sodium -TSH  SIRS (systemic inflammatory response syndrome) (HCC) Meeting criteria with tachycardia heart rate 86-114, leukocytosis of 23.7.  Tmax 99.2.  With lactic acid of 2.7 > 2.3.  CT suggest enteritis.  UA not suggestive of infection. - Obtain chest x-ray -Follow-up blood cultures -Hydrate -1.5 L bolus, continue hydration - Zosyn  given in ED, further antibiotics held for now.  Alcohol intoxication (HCC) Alcohol level 333.  Tachycardic to 112, reports tremulousness that has improved at the time of on my evaluation after Ativan -total of 3 mg given.  Last drink was this morning at about 10 AM.  Patient reports he is ready to quit drinking.  He is not telling me how much he actually drinks but tells me he drinks a lot of beer.  CIWA 12. -CIWA as needed and scheduled -Thiamine  folate multivitamins -Potassium 3.8, check magnesium  Elevated liver enzymes AST 139, ALT 128.  In the setting of chronic alcohol abuse.  CT abdomen and pelvis with no focal liver abnormality. - Trend   Anion gap metabolic acidosis-bicarb 19, anion gap of 18.  Likely from lactic acidosis, and alcoholic acidosis. - Hydrate  DVT prophylaxis: Lovenox  Code Status: Full code Family Communication: None at  bedside Disposition Plan:  ~ 2 days Consults called: None Admission status: inpt tele I certify that at the point of admission it is my clinical judgment that the patient will require inpatient hospital care spanning beyond 2 midnights from the point of admission due to high intensity of service, high risk for further deterioration and high frequency of surveillance required.   Author: Tully FORBES Carwin, MD 02/27/2024 7:13 PM  For on call review [email or web address].

## 2024-02-27 NOTE — Assessment & Plan Note (Addendum)
 AST 139, ALT 128.  In the setting of chronic alcohol abuse.  CT abdomen and pelvis with no focal liver abnormality. - Trend

## 2024-02-27 NOTE — ED Provider Notes (Signed)
 [place] EMERGENCY DEPARTMENT AT [facility] Provider Note   CSN: [id number] Arrival date & time: 02/27/24  1322     Patient presents with: Alcohol Intoxication   Dona Klemann is a 33 y.o. male.   Patient is a 33 year old male who presents to the emergency department secondary to possible alcohol withdrawal.  He did arrive by EMS.  It is noted that the family called EMS secondary to possible withdrawal.  At this time the patient notes that he is having associated fatigue, nausea.  He notes he does drink heavily daily but does not elaborate on how much he does drink.  He has no history of withdrawal to include DTs or seizures.  Patient notes that he has had no associated chest pain, shortness of breath, abdominal pain.  He notes that he does wish to stop drinking.   Alcohol Intoxication       Prior to Admission medications   Medication Sig Start Date End Date Taking? Authorizing Provider  acetaminophen  (TYLENOL ) 325 MG tablet Take 2 tablets (650 mg total) by mouth every 6 (six) hours as needed for up to 30 doses for mild pain or moderate pain. 10/13/20   Cottie Donnice PARAS, MD  chlordiazePOXIDE  (LIBRIUM ) 25 MG capsule 50mg  PO TID x 1D, then 25-50mg  PO BID X 1D, then 25-50mg  PO QD X 1D 12/09/23   Yolande Lamar BROCKS, MD  famotidine  (PEPCID ) 20 MG tablet Take 1 tablet (20 mg total) by mouth 2 (two) times daily. 12/09/23   Yolande Lamar BROCKS, MD  HYDROcodone -acetaminophen  (NORCO) 5-325 MG per tablet Take 1-2 tablets by mouth every 4 (four) hours as needed. 11/11/13   Tonia Chew, MD  ondansetron  (ZOFRAN -ODT) 4 MG disintegrating tablet Take 1 tablet (4 mg total) by mouth every 8 (eight) hours as needed for nausea or vomiting. 12/09/23   Yolande Lamar BROCKS, MD  penicillin v potassium (VEETID) 500 MG tablet Take 500 mg by mouth 4 (four) times daily.    [provider]    Allergies: Strawberry (diagnostic)    Review of Systems  Constitutional:  Positive for  fatigue.  Gastrointestinal:  Positive for nausea.  All other systems reviewed and are negative.   Updated Vital Signs BP 127/85   Pulse (!) 105   Temp 99.2 F (37.3 C) (Oral)   Resp 17   Ht 6' 1 (1.854 m)   Wt 106.6 kg   SpO2 91%   BMI 31.00 kg/m   Physical Exam Vitals and nursing note reviewed.  Constitutional:      General: He is not in acute distress.    Appearance: Normal appearance. He is not ill-appearing.  HENT:     Head: Normocephalic and atraumatic.     Nose: Nose normal.     Mouth/Throat:     Mouth: Mucous membranes are moist.  Eyes:     Extraocular Movements: Extraocular movements intact.     Conjunctiva/sclera: Conjunctivae normal.     Pupils: Pupils are equal, round, and reactive to light.  Cardiovascular:     Rate and Rhythm: Normal rate and regular rhythm.     Pulses: Normal pulses.     Heart sounds: Normal heart sounds. No murmur heard.    No gallop.  Pulmonary:     Effort: Pulmonary effort is normal. No respiratory distress.     Breath sounds: Normal breath sounds. No stridor. No wheezing, rhonchi or rales.  Abdominal:     General: Abdomen is flat. Bowel sounds are normal. There  is no distension.     Palpations: Abdomen is soft.     Tenderness: There is no abdominal tenderness. There is no guarding.  Musculoskeletal:        General: Normal range of motion.     Cervical back: Normal range of motion and neck supple. No rigidity or tenderness.     Right lower leg: No edema.     Left lower leg: No edema.  Skin:    General: Skin is warm and dry.     Findings: No bruising or rash.  Neurological:     General: No focal deficit present.     Mental Status: He is alert and oriented to person, place, and time. Mental status is at baseline.     Cranial Nerves: No cranial nerve deficit.     Sensory: No sensory deficit.     Motor: No weakness.     Coordination: Coordination normal.     Gait: Gait normal.  Psychiatric:        Mood and Affect: Mood normal.         Behavior: Behavior normal.        Thought Content: Thought content normal.        Judgment: Judgment normal.     (all labs ordered are listed, but only abnormal results are displayed) Labs Reviewed  COMPREHENSIVE METABOLIC PANEL WITH GFR  CBC WITH DIFFERENTIAL/PLATELET  LIPASE, BLOOD  ETHANOL  URINALYSIS, ROUTINE W REFLEX MICROSCOPIC  RAPID URINE DRUG SCREEN, HOSP PERFORMED    EKG: None  Radiology: No results found.   Procedures   Medications Ordered in the ED  LORazepam  (ATIVAN ) tablet 1-4 mg (has no administration in time range)    Or  LORazepam  (ATIVAN ) injection 1-4 mg (has no administration in time range)  folic acid  (FOLVITE ) tablet 1 mg (has no administration in time range)  multivitamin with minerals tablet 1 tablet (1 tablet Oral Given 02/27/24 1355)  sodium chloride  0.9 % bolus 1,000 mL (1,000 mLs Intravenous Bolus 02/27/24 1354)  thiamine  (VITAMIN B1) injection 100 mg (100 mg Intravenous Given 02/27/24 1355)  ondansetron  (ZOFRAN ) injection 4 mg (4 mg Intravenous Given 02/27/24 1355)  chlordiazePOXIDE  (LIBRIUM ) capsule 25 mg (25 mg Oral Given 02/27/24 1355)  pantoprazole  (PROTONIX ) injection 40 mg (40 mg Intravenous Given 02/27/24 1355)                                    Medical Decision Making Amount and/or Complexity of Data Reviewed Labs: ordered. Radiology: ordered.  Risk Prescription drug management. Decision regarding hospitalization.   This patient presents to the ED for concern of generalized malaise fatigue, nausea, alcohol abuse, this involves an extensive number of treatment options, and is a complaint that carries with it a high risk of complications and morbidity.  The differential diagnosis includes withdrawal, DTs, electrolyte derangement, acute kidney injury, hepatic encephalopathy, sepsis   Co morbidities that complicate the patient evaluation  Alcohol abuse   Additional history obtained:  Additional history obtained from medical  records External records from outside source obtained and reviewed including records   Lab Tests:  I Ordered, and personally interpreted labs.  The pertinent results include: Leukocytosis, no anemia, hyponatremia, normal kidney function, elevated AST and ALT, elevated lipase, elevated lactic acid, unremarkable urinalysis, alcohol of 333, negative drug screen   Imaging Studies ordered:  I ordered imaging studies including CT scan of abdomen and pelvis I independently visualized  and interpreted imaging which showed fluid-filled loops of small bowel consistent with enteritis I agree with the radiologist interpretation   Consultations Obtained:  I requested consultation with the hospitalist and,  and discussed lab and imaging findings as well as pertinent plan - they recommend: Admission   Problem List / ED Course / Critical interventions / Medication management  Patient is doing well at this time and does remain stable.  He has no active signs of withdrawal right now.  Did go ahead give patient a dose of Librium  and he is currently on the CIWA protocol.  Slowly hydrate at this point given his hyponatremia.  Discussed with patient we will plan for admission to hospital service for his hyponatremia, enteritis and lactic acidosis as well as associated leukocytosis.  He was given dose of Zosyn .  We will hold off on full sepsis fluid bolus at this point given his hyponatremia to avoid CPM.  CT scan of abdomen pelvis demonstrated no signs of acute surgical process.  Have discussed patient case with Dr. FORBES Carwin with the hospitalist service who has excepted for admission. I ordered medication including Librium , IV fluids, Zosyn , Protonix , thiamine  for hyponatremia, alcohol abuse, enteritis, leukocytosis, lactic acidosis Reevaluation of the patient after these medicines showed that the patient improved I have reviewed the patients home medicines and have made adjustments as needed   Social  Determinants of Health:  None   Test / Admission - Considered:  Admission     Final diagnoses:  None    ED Discharge Orders     None          Daralene Lonni BIRCH, PA-C 02/27/24 1724    Charlyn Sora, MD 03/04/24 [phone]

## 2024-02-27 NOTE — ED Notes (Signed)
 Patient transported to CT

## 2024-02-27 NOTE — Assessment & Plan Note (Addendum)
 Alcohol level 333.  Tachycardic to 112, reports tremulousness that has improved at the time of on my evaluation after Ativan -total of 3 mg given.  Last drink was this morning at about 10 AM.  Patient reports he is ready to quit drinking.  He is not telling me how much he actually drinks but tells me he drinks a lot of beer.  CIWA 12. -CIWA as needed and scheduled -Thiamine  folate multivitamins -Potassium 3.8, check magnesium

## 2024-02-28 DIAGNOSIS — F10920 Alcohol use, unspecified with intoxication, uncomplicated: Secondary | ICD-10-CM

## 2024-02-28 DIAGNOSIS — K2970 Gastritis, unspecified, without bleeding: Secondary | ICD-10-CM | POA: Diagnosis present

## 2024-02-28 DIAGNOSIS — E872 Acidosis, unspecified: Secondary | ICD-10-CM | POA: Diagnosis present

## 2024-02-28 DIAGNOSIS — Z91018 Allergy to other foods: Secondary | ICD-10-CM | POA: Diagnosis not present

## 2024-02-28 DIAGNOSIS — Z87891 Personal history of nicotine dependence: Secondary | ICD-10-CM | POA: Diagnosis not present

## 2024-02-28 DIAGNOSIS — E860 Dehydration: Secondary | ICD-10-CM | POA: Diagnosis present

## 2024-02-28 DIAGNOSIS — K529 Noninfective gastroenteritis and colitis, unspecified: Secondary | ICD-10-CM | POA: Diagnosis present

## 2024-02-28 DIAGNOSIS — E871 Hypo-osmolality and hyponatremia: Secondary | ICD-10-CM

## 2024-02-28 DIAGNOSIS — E876 Hypokalemia: Secondary | ICD-10-CM | POA: Diagnosis present

## 2024-02-28 DIAGNOSIS — Y908 Blood alcohol level of 240 mg/100 ml or more: Secondary | ICD-10-CM | POA: Diagnosis present

## 2024-02-28 DIAGNOSIS — R1310 Dysphagia, unspecified: Secondary | ICD-10-CM | POA: Diagnosis present

## 2024-02-28 DIAGNOSIS — R6510 Systemic inflammatory response syndrome (SIRS) of non-infectious origin without acute organ dysfunction: Secondary | ICD-10-CM

## 2024-02-28 DIAGNOSIS — R748 Abnormal levels of other serum enzymes: Secondary | ICD-10-CM

## 2024-02-28 DIAGNOSIS — K219 Gastro-esophageal reflux disease without esophagitis: Secondary | ICD-10-CM | POA: Diagnosis present

## 2024-02-28 DIAGNOSIS — Z87892 Personal history of anaphylaxis: Secondary | ICD-10-CM | POA: Diagnosis not present

## 2024-02-28 DIAGNOSIS — J45909 Unspecified asthma, uncomplicated: Secondary | ICD-10-CM | POA: Diagnosis present

## 2024-02-28 DIAGNOSIS — F10139 Alcohol abuse with withdrawal, unspecified: Secondary | ICD-10-CM | POA: Diagnosis present

## 2024-02-28 DIAGNOSIS — F10129 Alcohol abuse with intoxication, unspecified: Secondary | ICD-10-CM | POA: Diagnosis present

## 2024-02-28 DIAGNOSIS — E66811 Obesity, class 1: Secondary | ICD-10-CM | POA: Diagnosis present

## 2024-02-28 DIAGNOSIS — Z6832 Body mass index (BMI) 32.0-32.9, adult: Secondary | ICD-10-CM | POA: Diagnosis not present

## 2024-02-28 LAB — COMPREHENSIVE METABOLIC PANEL WITH GFR
ALT: 116 U/L — ABNORMAL HIGH (ref 0–44)
AST: 122 U/L — ABNORMAL HIGH (ref 15–41)
Albumin: 2.3 g/dL — ABNORMAL LOW (ref 3.5–5.0)
Alkaline Phosphatase: 64 U/L (ref 38–126)
Anion gap: 10 (ref 5–15)
BUN: 18 mg/dL (ref 6–20)
CO2: 22 mmol/L (ref 22–32)
Calcium: 6.7 mg/dL — ABNORMAL LOW (ref 8.9–10.3)
Chloride: 93 mmol/L — ABNORMAL LOW (ref 98–111)
Creatinine, Ser: 0.65 mg/dL (ref 0.61–1.24)
GFR, Estimated: >60 mL/min (ref >60)
Glucose, Bld: 119 mg/dL — ABNORMAL HIGH (ref 70–99)
Potassium: 3.7 mmol/L (ref 3.5–5.1)
Sodium: 125 mmol/L — ABNORMAL LOW (ref 135–145)
Total Bilirubin: 1.4 mg/dL — ABNORMAL HIGH (ref 0.0–1.2)
Total Protein: 4.5 g/dL — ABNORMAL LOW (ref 6.5–8.1)

## 2024-02-28 LAB — CBC
HCT: 40.9 % (ref 39.0–52.0)
Hemoglobin: 14.8 g/dL (ref 13.0–17.0)
MCH: 30.7 pg (ref 26.0–34.0)
MCHC: 36.2 g/dL — ABNORMAL HIGH (ref 30.0–36.0)
MCV: 84.9 fL (ref 80.0–100.0)
Platelets: 141 10*3/uL — ABNORMAL LOW (ref 150–400)
RBC: 4.82 MIL/uL (ref 4.22–5.81)
RDW: 12.3 % (ref 11.5–15.5)
WBC: 19.9 10*3/uL — ABNORMAL HIGH (ref 4.0–10.5)
nRBC: 0 % (ref 0.0–0.2)

## 2024-02-28 LAB — HIV ANTIBODY (ROUTINE TESTING W REFLEX): HIV Screen 4th Generation wRfx: NONREACTIVE

## 2024-02-28 LAB — LACTIC ACID, PLASMA: Lactic Acid, Venous: 2.3 mmol/L (ref 0.5–1.9)

## 2024-02-28 MED ORDER — SUCRALFATE 1 GM/10ML PO SUSP
1.0000 g | Freq: Three times a day (TID) | ORAL | Status: DC
  Administered 2024-02-28 – 2024-03-01 (×6): 1 g via ORAL
  Filled 2024-02-28 (×6): qty 10

## 2024-02-28 MED ORDER — SODIUM CHLORIDE 0.9 % IV SOLN: INTRAVENOUS | Status: DC

## 2024-02-28 MED ORDER — ALUM & MAG HYDROXIDE-SIMETH 200-200-20 MG/5ML PO SUSP
30.0000 mL | Freq: Once | ORAL | Status: AC
  Administered 2024-02-29: 30 mL via ORAL
  Filled 2024-02-28: qty 30

## 2024-02-28 MED ORDER — PANTOPRAZOLE SODIUM 40 MG PO TBEC
40.0000 mg | DELAYED_RELEASE_TABLET | Freq: Two times a day (BID) | ORAL | Status: DC
  Administered 2024-02-28 – 2024-03-01 (×5): 40 mg via ORAL
  Filled 2024-02-28 (×5): qty 1

## 2024-02-28 MED ORDER — FAMOTIDINE 20 MG PO TABS
40.0000 mg | ORAL_TABLET | Freq: Every day | ORAL | Status: DC
  Administered 2024-02-29 (×2): 40 mg via ORAL
  Filled 2024-02-28 (×2): qty 2

## 2024-02-28 MED ORDER — LIDOCAINE VISCOUS HCL 2 % MT SOLN
15.0000 mL | Freq: Once | OROMUCOSAL | Status: AC
  Administered 2024-02-29: 15 mL via OROMUCOSAL
  Filled 2024-02-28 (×2): qty 15

## 2024-02-28 MED ORDER — ALUM & MAG HYDROXIDE-SIMETH 200-200-20 MG/5ML PO SUSP
30.0000 mL | ORAL | Status: DC | PRN
  Administered 2024-02-28 – 2024-03-01 (×2): 30 mL via ORAL
  Filled 2024-02-28 (×2): qty 30

## 2024-02-28 MED ORDER — CHLORHEXIDINE GLUCONATE CLOTH 2 % EX PADS
6.0000 | MEDICATED_PAD | Freq: Every day | CUTANEOUS | Status: DC
Start: 2024-02-28 — End: 2024-03-01
  Administered 2024-02-28: 6 via TOPICAL

## 2024-02-28 NOTE — Plan of Care (Signed)

## 2024-02-28 NOTE — Progress Notes (Signed)
 Progress Note   Patient: Terry Simmons FMW:[id number] DOB: 05-10-1991 DOA: 02/27/2024     0  DOS: the patient was seen and examined on 02/28/2024   Brief hospital course: As per H&P written by Dr. Pearlean on 02/27/2024 Terry Simmons is a 33 y.o. male with medical history significant for asthma, alcohol abuse. Patient was brought to the ED by EMS after family and patient called for alcohol withdrawal.  Patient reports his last drink of alcohol was this morning at about 10 AM, he tells me he started going through withdrawal last night.  He wants to quit drinking alcohol.  He tells me he drinks a lot of beer, he does not drink liquor, is not telling me how much alcohol he actually drinks.  He reports tremulousness, nausea and vomiting, and feeling like crap.  He denies hallucinations auditory or visual.  He also reports diarrhea without blood over the past few days.  No abdominal pain.  He reports he quit drinking for 60 days but resumed drinking 2 weeks ago. He reports history of the shakes when he does not drink, but no hospitalization for alcohol withdrawal, no seizures.   ED Course: Temperature 99.2.  Heart rate 86-114.  Respiratory rate 16-29.  Blood pressure systolic 107-130. Blood alcohol level 333. Sodium 122. Anion gap of 18, serum bicarb of 19.  Lactic acid of 2.7. AST 139, ALT 128. WBC 23.7. CT shows just nonspecific enteritis,  Fatty changes at the ileocecal junction could reflect sequela of prior inflammation. 1 L bolus given.  Zosyn  started.   Assessment and plan  Alcohol intoxication (HCC) - Cessation counseling provided - Continue to follow CIWA protocol and as needed Ativan  - Continue thiamine  and folic acid  - Maintain adequate hydration - Replete electrolytes as needed and follow clinical response.  Elevated liver enzymes -In the setting of alcohol abuse - Continue to maintain adequate hydration - Follow LFTs intermittently - Cessation counseling  provided.  GERD/gastritis - PPI twice a day along with the use of nightly Pepcid  and Carafate  has been started - Head of bed elevation and lifestyle changes discussed with patient.  Hyponatremia -In the setting of beer potomania and dehydration most likely - Continue fluid resuscitation - Follow electrolytes trend - Continue supportive care.  SIRS (systemic inflammatory response syndrome) (HCC) -Patient met criteria for SIRS at time of admission with tachycardia heart rate 86-114, leukocytosis of 23.7.  Tmax 99.2.  With lactic acid of 2.7 > 2.3.  CT suggest enteritis.  UA not suggestive of infection. - No source of infection identified - Most likely in the setting of alcohol abuse and withdrawal - Will continue close monitoring without the use of antibiotic therapy at the moment - Continue to maintain adequate hydration and follow electrolytes trend and blood work results.  Class I obesity -Body mass index is 32.02 kg/m. -Low-calorie diet and portion control discussed with patient.  Subjective:  Reported burning sensation in the medial of his chest and epigastric area; no nausea or vomiting currently.  Patient is afebrile and respiration no shortness of breath, palpitations, melena/hematochezia or dysuria.  Physical Exam: Vitals:   02/28/24 0800 02/28/24 0907 02/28/24 1209 02/28/24 1346  BP: 129/65 132/72  (!) 153/85  Pulse: 85   76  Resp:      Temp:   98.2 F (36.8 C)   TempSrc:   Axillary   SpO2: 97% 98%    Weight:      Height:       General  exam: Alert, awake, oriented x 3; in no major distress. Respiratory system: Clear to auscultation. Respiratory effort normal. Cardiovascular system:RRR. No murmurs, rubs, gallops. Gastrointestinal system: Abdomen is obese, nondistended, soft and nontender. No organomegaly or masses felt. Normal bowel sounds heard. Central nervous system: No focal neurological deficits. Extremities: No cyanosis or clubbing. Skin: No rashes, lesions  or ulcers Psychiatry: Judgement and insight appear normal. Mood & affect appropriate.    Data Reviewed: Comprehensive metabolic panel: Sodium 125, potassium 3.7, chloride 93, bicarb 22, BUN 18, creatinine 0.65, AST 122, ALT 116 and GFR >60 CBC: WBCs 19.9, hemoglobin 14.8 and platelet count 1 41K Lactic acid: 2.3  Family Communication: No family at bedside.  Disposition: Status is: Inpatient Remains inpatient appropriate because: Continue IV therapy.  Anticipating discharge back home once medically stable.  Time spent: 50 minutes  Author: Eric Nunnery, MD 02/28/2024 1:52 PM  For on call review [email or web address].

## 2024-02-28 NOTE — Progress Notes (Signed)
 Critical lab Documentation   02/28/24 0436  Provider Notification  Provider Name/Title Dr. Manfred  Date Provider Notified 02/28/24  Time Provider Notified [phone]  Method of Notification Page (Secure chat)  Notification Reason Critical Result  Test performed and critical result Lactic Acid 2.3  Date Critical Result Received 02/28/24  Time Critical Result Received 0430  Provider response Other (Comment) (awaiting)

## 2024-02-28 NOTE — TOC CM/SW Note (Signed)
 Transition of Care [facility]) - Inpatient Brief Assessment   Patient Details  Name: Terry Simmons MRN: [id number] Date of Birth: [birth year]  [facility] CM/SW Contact:    Lucie Lunger, LCSWA Phone Number: 02/28/2024, 9:31 AM  Clinical Narrative: ED CSW spoke with pt at bedside about substance use and added resources to AVS for pt to review at D/C.   Transition of Care Department [facility]) has reviewed patient and no TOC needs have been identified at this time. We will continue to monitor patient advancement through interdiciplinary progression rounds. If new patient transition needs arise, please place a TOC consult.  Transition of Care Asessment: Insurance and Status: Insurance coverage has been reviewed Patient has primary care physician: Yes Home environment has been reviewed: From home Prior level of function:: Independent Prior/Current Home Services: No current home services Social Drivers of Health Review: SDOH reviewed no interventions necessary Readmission risk has been reviewed: Yes Transition of care needs: no transition of care needs at this time

## 2024-02-29 LAB — CBC
HCT: 39.5 % (ref 39.0–52.0)
Hemoglobin: 13.6 g/dL (ref 13.0–17.0)
MCH: 30.7 pg (ref 26.0–34.0)
MCHC: 34.4 g/dL (ref 30.0–36.0)
MCV: 89.2 fL (ref 80.0–100.0)
Platelets: 117 K/uL — ABNORMAL LOW (ref 150–400)
RBC: 4.43 MIL/uL (ref 4.22–5.81)
RDW: 12.8 % (ref 11.5–15.5)
WBC: 9.4 K/uL (ref 4.0–10.5)
nRBC: 0 % (ref 0.0–0.2)

## 2024-02-29 LAB — BASIC METABOLIC PANEL WITH GFR
Anion gap: 7 (ref 5–15)
BUN: 10 mg/dL (ref 6–20)
CO2: 24 mmol/L (ref 22–32)
Calcium: 6.9 mg/dL — ABNORMAL LOW (ref 8.9–10.3)
Chloride: 99 mmol/L (ref 98–111)
Creatinine, Ser: 0.71 mg/dL (ref 0.61–1.24)
GFR, Estimated: >60 mL/min (ref >60)
Glucose, Bld: 102 mg/dL — ABNORMAL HIGH (ref 70–99)
Potassium: 2.9 mmol/L — ABNORMAL LOW (ref 3.5–5.1)
Sodium: 130 mmol/L — ABNORMAL LOW (ref 135–145)

## 2024-02-29 LAB — LACTIC ACID, PLASMA: Lactic Acid, Venous: 2.5 mmol/L (ref 0.5–1.9)

## 2024-02-29 MED ORDER — POTASSIUM CHLORIDE CRYS ER 20 MEQ PO TBCR
40.0000 meq | EXTENDED_RELEASE_TABLET | ORAL | Status: AC
  Administered 2024-02-29 (×3): 40 meq via ORAL
  Filled 2024-02-29 (×3): qty 2

## 2024-02-29 MED ORDER — SODIUM CHLORIDE 0.9 % IV SOLN: INTRAVENOUS | Status: AC

## 2024-02-29 NOTE — Progress Notes (Signed)
 While giving report to day shift nurse, a loud thud was heard from pts room. Pt in bathroom laying on floor. Pt stated he hit his forehead. Redness and swelling was visible. VS obtained, assisted pt back to bed. Post fall protocol implemented.

## 2024-02-29 NOTE — Progress Notes (Signed)
 Progress Note   Patient: Terry Simmons FMW:[id number] DOB: 1990-12-20 DOA: 02/27/2024     1  DOS: the patient was seen and examined on 02/29/2024   Brief hospital course: As per H&P written by Dr. Pearlean on 02/27/2024 Terry Simmons is a 33 y.o. male with medical history significant for asthma, alcohol abuse. Patient was brought to the ED by EMS after family and patient called for alcohol withdrawal.  Patient reports his last drink of alcohol was this morning at about 10 AM, he tells me he started going through withdrawal last night.  He wants to quit drinking alcohol.  He tells me he drinks a lot of beer, he does not drink liquor, is not telling me how much alcohol he actually drinks.  He reports tremulousness, nausea and vomiting, and feeling like crap.  He denies hallucinations auditory or visual.  He also reports diarrhea without blood over the past few days.  No abdominal pain.  He reports he quit drinking for 60 days but resumed drinking 2 weeks ago. He reports history of the shakes when he does not drink, but no hospitalization for alcohol withdrawal, no seizures.   ED Course: Temperature 99.2.  Heart rate 86-114.  Respiratory rate 16-29.  Blood pressure systolic 107-130. Blood alcohol level 333. Sodium 122. Anion gap of 18, serum bicarb of 19.  Lactic acid of 2.7. AST 139, ALT 128. WBC 23.7. CT shows just nonspecific enteritis,  Fatty changes at the ileocecal junction could reflect sequela of prior inflammation. 1 L bolus given.  Zosyn  started.   Assessment and plan  Alcohol intoxication (HCC) - Cessation counseling provided - Continue to follow CIWA protocol and as needed Ativan  - Continue thiamine  and folic acid  - Maintain adequate hydration - Replete electrolytes as needed and follow clinical response.  Elevated liver enzymes -In the setting of alcohol abuse - Continue to maintain adequate hydration - Follow LFTs intermittently - Cessation counseling  provided.  GERD/gastritis - PPI twice a day along with the use of nightly Pepcid  and Carafate  has been started - Head of bed elevation and lifestyle changes discussed with patient. - Diet adjusted to mechanical soft to further minimize difficulty swallowing.  Hyponatremia -In the setting of beer potomania and dehydration most likely -Continue to follow electrolytes trend - Continue supportive care. - Sodium up to 130 currently.  Continue to maintain adequate hydration.  Hypokalemia: - Will replete electrolytes and follow trend - Checking magnesium level.  SIRS (systemic inflammatory response syndrome) (HCC) -Patient met criteria for SIRS at time of admission with tachycardia heart rate 86-114, leukocytosis of 23.7.  Tmax 99.2.  With lactic acid of 2.7 > 2.3.  CT suggest enteritis.  UA not suggestive of infection. - No source of infection identified - Most likely in the setting of alcohol abuse and withdrawal - Will continue close monitoring without the use of antibiotic therapy at the moment - Continue to maintain adequate hydration and follow electrolytes trend and blood work results.  Class I obesity -Body mass index is 32.02 kg/m. -Low-calorie diet and portion control discussed with patient.  Subjective:  Experiencing mechanical fall after losing balance in the way to the bathroom; reports hitting his forehead against the wall without significant injury.  No chest pain, no nausea, no vomiting.  Complaining of reflux symptoms.  Physical Exam: Vitals:   02/29/24 1317 02/29/24 1520 02/29/24 1612 02/29/24 1752  BP: 125/78 (!) 145/92 (!) 147/88 136/82  Pulse: 75 75 72 75  Resp:  17  (!)  24  Temp:    98.4 F (36.9 C)  TempSrc:    Oral  SpO2:  100%  100%  Weight:      Height:       General exam: Alert, awake, oriented x 3; following commands appropriately.,  Coherent and demonstrating good insight. Respiratory system: Clear to auscultation. Respiratory effort normal.  Good  saturation on room air. Cardiovascular system: Rate controlled.  No murmurs, rubs, gallops. Gastrointestinal system: Abdomen is obese, nondistended, soft and nontender. No organomegaly or masses felt. Normal bowel sounds heard. Central nervous system: Alert and oriented. No focal neurological deficits. Extremities: No C/C/E, +pedal pulses Skin: No rashes, lesions or ulcers Psychiatry: Mood & affect appropriate.     Latest data Reviewed: Basic metabolic panel: Sodium 130, potassium 2.9, chloride 99, bicarb 24, BUN 10, creatinine 0.71 and GFR >60 RAR:TARd 9.4, globin 13.6 and platelet count 117K Lactic acid: 2.5  Family Communication: No family at bedside.  Disposition: Status is: Inpatient Remains inpatient appropriate because: Continue IV therapy.  Anticipating discharge back home once medically stable.  Time spent: 50 minutes  Author: Eric Nunnery, MD 02/29/2024 7:14 PM  For on call review [email or web address].

## 2024-02-29 NOTE — Progress Notes (Signed)
 Date and time results received: 02/29/24 0521   Test: Lactic Acid Critical Value: 2.5  Name of Provider Notified: Segars, MD  Orders Received? New orders Or Actions Taken?: Increase NS fluids to 100 ml/hr  Charge Nurse Earla Gully, RN notified

## 2024-02-29 NOTE — Plan of Care (Signed)
  Problem: Education: Goal: Knowledge of General Education information will improve Description: Including pain rating scale, medication(s)/side effects and non-pharmacologic comfort measures Outcome: Progressing   Problem: Coping: Goal: Level of anxiety will decrease Outcome: Progressing   Problem: Elimination: Goal: Will not experience complications related to urinary retention Outcome: Progressing   Problem: Safety: Goal: Ability to remain free from injury will improve Outcome: Progressing   Problem: Skin Integrity: Goal: Risk for impaired skin integrity will decrease Outcome: Progressing   

## 2024-02-29 NOTE — Plan of Care (Signed)

## 2024-02-29 NOTE — Progress Notes (Addendum)
 At 0729 Made Dr Ricky aware of patient fall. Patient has swelling and redness to forehead. Denies pain. Is oriented x 4. Neuro is WDL. VSS. Charge nurse Crystal aware will assist with messaging MD through [employer] system. CIWA 4

## 2024-03-01 DIAGNOSIS — E876 Hypokalemia: Secondary | ICD-10-CM

## 2024-03-01 DIAGNOSIS — E872 Acidosis, unspecified: Secondary | ICD-10-CM

## 2024-03-01 LAB — BASIC METABOLIC PANEL WITH GFR
Anion gap: 10 (ref 5–15)
BUN: 7 mg/dL (ref 6–20)
CO2: 25 mmol/L (ref 22–32)
Calcium: 7.4 mg/dL — ABNORMAL LOW (ref 8.9–10.3)
Chloride: 98 mmol/L (ref 98–111)
Creatinine, Ser: 0.51 mg/dL — ABNORMAL LOW (ref 0.61–1.24)
GFR, Estimated: >60 mL/min (ref >60)
Glucose, Bld: 102 mg/dL — ABNORMAL HIGH (ref 70–99)
Potassium: 3.7 mmol/L (ref 3.5–5.1)
Sodium: 133 mmol/L — ABNORMAL LOW (ref 135–145)

## 2024-03-01 LAB — MAGNESIUM: Magnesium: 2 mg/dL (ref 1.7–2.4)

## 2024-03-01 MED ORDER — VITAMIN B-1 100 MG PO TABS: 100.0000 mg | ORAL_TABLET | Freq: Every day | ORAL | 0 refills | Status: AC

## 2024-03-01 MED ORDER — ACETAMINOPHEN 500 MG PO TABS: 1000.0000 mg | ORAL_TABLET | Freq: Three times a day (TID) | ORAL | Status: AC | PRN

## 2024-03-01 MED ORDER — CHLORDIAZEPOXIDE HCL 5 MG PO CAPS: ORAL_CAPSULE | ORAL | 0 refills | Status: DC

## 2024-03-01 MED ORDER — FAMOTIDINE 20 MG PO TABS: 40.0000 mg | ORAL_TABLET | Freq: Every day | ORAL | 1 refills | Status: AC

## 2024-03-01 MED ORDER — HYDROXYZINE HCL 25 MG PO TABS: 25.0000 mg | ORAL_TABLET | Freq: Three times a day (TID) | ORAL | 0 refills | Status: AC | PRN

## 2024-03-01 MED ORDER — FOLIC ACID 1 MG PO TABS: 1.0000 mg | ORAL_TABLET | Freq: Every day | ORAL | 0 refills | Status: AC

## 2024-03-01 MED ORDER — SUCRALFATE 1 GM/10ML PO SUSP: 2.0000 g | Freq: Three times a day (TID) | ORAL | 0 refills | Status: AC

## 2024-03-01 MED ORDER — CHLORDIAZEPOXIDE HCL 5 MG PO CAPS: ORAL_CAPSULE | ORAL | 0 refills | Status: AC

## 2024-03-01 MED ORDER — PANTOPRAZOLE SODIUM 40 MG PO TBEC: 40.0000 mg | DELAYED_RELEASE_TABLET | Freq: Two times a day (BID) | ORAL | 2 refills | Status: AC

## 2024-03-01 NOTE — Discharge Summary (Signed)
 Physician Discharge Summary   Patient: Terry Simmons MRN: [id number] DOB: [birth year]  Admit date:     02/27/2024  Discharge date: 03/01/24  Discharge Physician: Eric Nunnery   PCP: Chrystal Lamarr RAMAN, MD   Recommendations at discharge:  Continue assisting with alcohol cessation Repeat complete metabolic panel to follow electrolytes, LFTs and renal function trend. Repeat CBC to follow hemoglobin trend/stability Assist patient with weight loss management.   Discharge Diagnoses: Principal Problem:   Hyponatremia Active Problems:   Elevated liver enzymes   Alcohol intoxication (HCC)   SIRS (systemic inflammatory response syndrome) (HCC)   Lactic acidosis   Hypokalemia  Brief hospital course: As per H&P written by Dr. Pearlean on 02/27/2024 Terry Simmons is a 33 y.o. male with medical history significant for asthma, alcohol abuse. Patient was brought to the ED by EMS after family and patient called for alcohol withdrawal.  Patient reports his last drink of alcohol was this morning at about 10 AM, he tells me he started going through withdrawal last night.  He wants to quit drinking alcohol.  He tells me he drinks a lot of beer, he does not drink liquor, is not telling me how much alcohol he actually drinks.  He reports tremulousness, nausea and vomiting, and feeling like crap.  He denies hallucinations auditory or visual.  He also reports diarrhea without blood over the past few days.  No abdominal pain.  He reports he quit drinking for 60 days but resumed drinking 2 weeks ago. He reports history of the shakes when he does not drink, but no hospitalization for alcohol withdrawal, no seizures.   ED Course: Temperature 99.2.  Heart rate 86-114.  Respiratory rate 16-29.  Blood pressure systolic 107-130. Blood alcohol level 333. Sodium 122. Anion gap of 18, serum bicarb of 19.  Lactic acid of 2.7. AST 139, ALT 128. WBC 23.7. CT shows just nonspecific enteritis,  Fatty  changes at the ileocecal junction could reflect sequela of prior inflammation. 1 L bolus given.  Zosyn  started.   Assessment and Plan: Alcohol intoxication (HCC) - Cessation counseling provided - No active withdrawal symptoms at discharge - Patient instructed to follow Librium  tapering and as needed Atarax . - Continue thiamine  and folic acid  - Maintain adequate hydration - Outpatient resources for assistance quitting alcohol provided by TOC.   Elevated liver enzymes -In the setting of alcohol abuse - Continue to maintain adequate hydration - Follow LFTs intermittently - Cessation counseling and instruction for complete abstinence provided.   GERD/gastritis - PPI twice a day along with the use of nightly Pepcid  and Carafate  has been started - Head of bed elevation and lifestyle changes discussed with patient. - Diet adjusted to mechanical soft to further minimize difficulty swallowing. -Patient advised to quit alcohol consumption and to maintain adequate hydration.   Hyponatremia -In the setting of beer potomania and dehydration most likely -Continue to follow electrolytes trend - Continue supportive care. - Sodium up to 133 at time of discharge.  Continue to maintain adequate hydration.   Hypokalemia: - Will replete electrolytes and follow trend - Checking magnesium level.   SIRS (systemic inflammatory response syndrome) (HCC) -Patient met criteria for SIRS at time of admission with tachycardia heart rate 86-114, leukocytosis of 23.7.  Tmax 99.2.  With lactic acid of 2.7 > 2.3.  CT suggest enteritis.  UA not suggestive of infection. - No source of infection identified - Most likely in the setting of alcohol abuse and withdrawal - Will continue close monitoring  without the use of antibiotic therapy at the moment - Continue to maintain adequate hydration and follow electrolytes trend and blood work results.   Class I obesity -Body mass index is 32.02 kg/m. -Low-calorie diet  and portion control discussed with patient.  Consultants: GI service curbside (recommendation for 2 weeks for therapy using PPI, Carafate  and famotidine  recommended) the patient feels improved endoscopic evaluation can be pursued as an outpatient. Procedures performed: See below for x-ray reports. Disposition: Home Diet recommendation: Low calorie diet; mechanical soft consistency to facilitate swallowing and digestion.  DISCHARGE MEDICATION: Allergies as of 03/01/2024       Reactions   Strawberry (diagnostic) Anaphylaxis   Allergy test done when he was a kid         Medication List     STOP taking these medications    celecoxib 100 MG capsule Commonly known as: CELEBREX   ibuprofen  200 MG tablet Commonly known as: ADVIL        TAKE these medications    acetaminophen  500 MG tablet Commonly known as: TYLENOL  Take 2 tablets (1,000 mg total) by mouth every 8 (eight) hours as needed for mild pain (pain score 1-3), fever or headache.   chlordiazePOXIDE  5 MG capsule Commonly known as: LIBRIUM  Take 2 tablets by mouth 3 times a day x 2 days; then take 1 tablet in the morning and 1 tablet around midday, with 2 tablets at nighttime for 2 consecutive days; then 1 tablet by mouth 3 times a day for 2 days; then 1 tablet by mouth twice a day for 3 days; then 1 tablet by mouth daily for 3 days and stop Librium .   famotidine  20 MG tablet Commonly known as: PEPCID  Take 2 tablets (40 mg total) by mouth at bedtime. What changed:  how much to take when to take this   folic acid  1 MG tablet Commonly known as: FOLVITE  Take 1 tablet (1 mg total) by mouth daily.   hydrOXYzine  25 MG tablet Commonly known as: ATARAX  Take 1 tablet (25 mg total) by mouth every 8 (eight) hours as needed for anxiety.   methocarbamol 500 MG tablet Commonly known as: ROBAXIN Take 500 mg by mouth 2 (two) times daily as needed for muscle spasms.   pantoprazole  40 MG tablet Commonly known as: PROTONIX  Take 1  tablet (40 mg total) by mouth 2 (two) times daily before a meal.   sucralfate  1 GM/10ML suspension Commonly known as: CARAFATE  Take 20 mLs (2 g total) by mouth 3 (three) times daily.   thiamine  100 MG tablet Commonly known as: Vitamin B-1 Take 1 tablet (100 mg total) by mouth daily.        Follow-up Information     Chrystal Lamarr RAMAN, MD. Schedule an appointment as soon as possible for a visit in 10 day(s).   Specialty: Family Medicine Contact information: [address] [place] [address] [phone]         Eartha Flavors, Toribio, MD. Schedule an appointment as soon as possible for a visit in 2 week(s).   Specialty: Gastroenterology Why: As needed, If symptoms worsen or failed to improve Contact information: [address] [place] [address] [phone]                Discharge Exam: Fredricka Weights   02/27/24 1332 02/27/24 1826  Weight: 106.6 kg 107.1 kg   General exam: Alert, awake, oriented x 3 Respiratory system: Clear to auscultation. Respiratory effort normal. Cardiovascular system:RRR. No murmurs,  rubs, gallops. Gastrointestinal system: Abdomen is nondistended, soft and nontender. No organomegaly or masses felt. Normal bowel sounds heard. Central nervous system: Alert and oriented. No focal neurological deficits. Extremities: No C/C/E, +pedal pulses Skin: No rashes, lesions or ulcers Psychiatry: Judgement and insight appear normal. Mood & affect appropriate.    Condition at discharge: Stable and improved.  The results of significant diagnostics from this hospitalization (including imaging, microbiology, ancillary and laboratory) are listed below for reference.   Imaging Studies: CT ABDOMEN PELVIS W CONTRAST Result Date: 02/27/2024 CLINICAL DATA:  Epigastric pain. EXAM: CT ABDOMEN AND PELVIS WITH CONTRAST TECHNIQUE: Multidetector CT imaging of the abdomen and pelvis was performed using the standard protocol  following bolus administration of intravenous contrast. RADIATION DOSE REDUCTION: This exam was performed according to the departmental dose-optimization program which includes automated exposure control, adjustment of the mA and/or kV according to patient size and/or use of iterative reconstruction technique. CONTRAST:  OMNIPAQUE  IOHEXOL  300 MG/ML  SOLN COMPARISON:  None Available. FINDINGS: Lower chest: Dependent changes at the posterior lung bases. Hepatobiliary: No focal liver abnormality is seen. No gallstones, gallbladder wall thickening, or biliary dilatation. Pancreas: Unremarkable. No pancreatic ductal dilatation or surrounding inflammatory changes. Spleen: Normal in size without focal abnormality. Adrenals/Urinary Tract: Adrenal glands are unremarkable. Kidneys enhance symmetrically. No renal or ureteral calculi. No hydronephrosis. Bladder is unremarkable. Stomach/Bowel: Stomach is minimally distended and otherwise unremarkable. Fluid-filled loops of proximal to mid small bowel with wall thickening and mucosal hyperenhancement, suggestive of a nonspecific enteritis. No obstruction. Appendix is normal. Fatty changes at the ileocecal junction could reflect sequela of prior inflammation. Vascular/Lymphatic: Abdominal aorta is normal in caliber. No enlarged abdominal or pelvic lymph nodes. Reproductive: Unremarkable. Other: No abdominopelvic ascites. No intraperitoneal free air. No abdominal wall hernia. Musculoskeletal: No acute or significant osseous findings. IMPRESSION: 1. Fluid-filled loops of proximal to mid small bowel with wall thickening and mucosal hyperenhancement, suggestive of a nonspecific enteritis. 2. Fatty changes at the ileocecal junction could reflect sequela of prior inflammation. Electronically Signed   By: Harrietta Sherry M.D.   On: 02/27/2024 16:05    Microbiology: Results for orders placed or performed during the hospital encounter of 02/27/24  Culture, blood (routine x 2)      Status: None (Preliminary result)   Collection Time: 02/27/24  3:04 PM   Specimen: BLOOD  Result Value Ref Range Status   Specimen Description BLOOD BLOOD LEFT ARM  Final   Special Requests   Final    BOTTLES DRAWN AEROBIC ONLY Blood Culture results may not be optimal due to an inadequate volume of blood received in culture bottles   Culture   Final    NO GROWTH 3 DAYS Performed at [facility], [address], [place], [place] [address]    Report Status PENDING  Incomplete  Culture, blood (routine x 2)     Status: None (Preliminary result)   Collection Time: 02/27/24  3:04 PM   Specimen: BLOOD  Result Value Ref Range Status   Specimen Description BLOOD BLOOD LEFT HAND  Final   Special Requests   Final    BOTTLES DRAWN AEROBIC AND ANAEROBIC Blood Culture results may not be optimal due to an inadequate volume of blood received in culture bottles   Culture   Final    NO GROWTH 3 DAYS Performed at [facility], [address] [place], [place] [address]    Report Status PENDING  Incomplete  MRSA Next Gen by PCR, Nasal  Status: None   Collection Time: 02/27/24  6:30 PM   Specimen: Nasal Mucosa; Nasal Swab  Result Value Ref Range Status   MRSA by PCR Next Gen NOT DETECTED NOT DETECTED Final    Comment: (NOTE) The GeneXpert MRSA Assay (FDA approved for NASAL specimens only), is one component of a comprehensive MRSA colonization surveillance program. It is not intended to diagnose MRSA infection nor to guide or monitor treatment for MRSA infections. Test performance is not FDA approved in patients less than 74 years old. Performed at [facility], [address] [place], [place] [address]     Labs: CBC: Recent Labs  Lab 02/27/24 1354 02/28/24 0334 02/29/24 0352  WBC 23.7* 19.9* 9.4  NEUTROABS 18.3*  --   --   HGB 17.3* 14.8 13.6  HCT 47.2 40.9 39.5  MCV 81.5 84.9 89.2  PLT 197 141* 117*   Basic Metabolic Panel: Recent Labs  Lab 02/27/24 1354 02/28/24 0334  02/29/24 0352 03/01/24 0436  NA 122* 125* 130* 133*  K 3.8 3.7 2.9* 3.7  CL 85* 93* 99 98  CO2 19* 22 24 25   GLUCOSE 157* 119* 102* 102*  BUN 16 18 10 7   CREATININE 0.59* 0.65 0.71 0.51*  CALCIUM 7.2* 6.7* 6.9* 7.4*  MG 1.9  --   --  2.0   Liver Function Tests: Recent Labs  Lab 02/27/24 1354 02/28/24 0334  AST 139* 122*  ALT 128* 116*  ALKPHOS 75 64  BILITOT 1.1 1.4*  PROT 5.4* 4.5*  ALBUMIN 2.8* 2.3*   CBG: No results for input(s): GLUCAP in the last 168 hours.  Discharge time spent:  35 minutes.  Signed: Eric Nunnery, MD [facility] 03/01/2024

## 2024-03-01 NOTE — Progress Notes (Signed)
 Late Entry for 02/29/24 0800 See post fall flowsheet completed in the 0700 hour on 02/29/24 Patient fell at 0725 on 02/29/24

## 2024-03-01 NOTE — Plan of Care (Signed)
  Problem: Education: Goal: Knowledge of General Education information will improve Description: Including pain rating scale, medication(s)/side effects and non-pharmacologic comfort measures Outcome: Progressing   Problem: Clinical Measurements: Goal: Diagnostic test results will improve Outcome: Progressing   Problem: Coping: Goal: Level of anxiety will decrease Outcome: Progressing   

## 2024-03-03 LAB — CULTURE, BLOOD (ROUTINE X 2)
Culture: NO GROWTH
Culture: NO GROWTH

## 2024-05-06 ENCOUNTER — Emergency Department (HOSPITAL_BASED_OUTPATIENT_CLINIC_OR_DEPARTMENT_OTHER)

## 2024-05-06 ENCOUNTER — Other Ambulatory Visit: Payer: Self-pay

## 2024-05-06 ENCOUNTER — Emergency Department (HOSPITAL_BASED_OUTPATIENT_CLINIC_OR_DEPARTMENT_OTHER)
Admission: EM | Admit: 2024-05-06 | Discharge: 2024-05-06 | Disposition: A | Discharge status: Home / Self Care | Attending: Emergency Medicine | Admitting: Emergency Medicine

## 2024-05-06 DIAGNOSIS — R519 Headache, unspecified: Secondary | ICD-10-CM | POA: Diagnosis not present

## 2024-05-06 DIAGNOSIS — R0789 Other chest pain: Secondary | ICD-10-CM | POA: Insufficient documentation

## 2024-05-06 DIAGNOSIS — F419 Anxiety disorder, unspecified: Secondary | ICD-10-CM | POA: Diagnosis not present

## 2024-05-06 DIAGNOSIS — R079 Chest pain, unspecified: Secondary | ICD-10-CM | POA: Diagnosis present

## 2024-05-06 LAB — CBC WITH DIFFERENTIAL/PLATELET
Abs Immature Granulocytes: 0.02 K/uL (ref 0.00–0.07)
Basophils Absolute: 0 K/uL (ref 0.0–0.1)
Basophils Relative: 1 %
Eosinophils Absolute: 0 K/uL (ref 0.0–0.5)
Eosinophils Relative: 0 %
HCT: 44.9 % (ref 39.0–52.0)
Hemoglobin: 15.2 g/dL (ref 13.0–17.0)
Immature Granulocytes: 0 %
Lymphocytes Relative: 41 %
Lymphs Abs: 3 K/uL (ref 0.7–4.0)
MCH: 29.7 pg (ref 26.0–34.0)
MCHC: 33.9 g/dL (ref 30.0–36.0)
MCV: 87.7 fL (ref 80.0–100.0)
Monocytes Absolute: 0.5 K/uL (ref 0.1–1.0)
Monocytes Relative: 7 %
Neutro Abs: 3.7 K/uL (ref 1.7–7.7)
Neutrophils Relative %: 51 %
Platelets: 230 K/uL (ref 150–400)
RBC: 5.12 MIL/uL (ref 4.22–5.81)
RDW: 13.2 % (ref 11.5–15.5)
WBC: 7.2 K/uL (ref 4.0–10.5)
nRBC: 0 % (ref 0.0–0.2)

## 2024-05-06 LAB — COMPREHENSIVE METABOLIC PANEL WITH GFR
ALT: 17 U/L (ref 0–44)
AST: 17 U/L (ref 15–41)
Albumin: 4.4 g/dL (ref 3.5–5.0)
Alkaline Phosphatase: 83 U/L (ref 38–126)
Anion gap: 12 (ref 5–15)
BUN: 8 mg/dL (ref 6–20)
CO2: 23 mmol/L (ref 22–32)
Calcium: 9.9 mg/dL (ref 8.9–10.3)
Chloride: 104 mmol/L (ref 98–111)
Creatinine, Ser: 0.62 mg/dL (ref 0.61–1.24)
GFR, Estimated: >60 mL/min (ref >60)
Glucose, Bld: 103 mg/dL — ABNORMAL HIGH (ref 70–99)
Potassium: 3.8 mmol/L (ref 3.5–5.1)
Sodium: 139 mmol/L (ref 135–145)
Total Bilirubin: 0.5 mg/dL (ref 0.0–1.2)
Total Protein: 7.6 g/dL (ref 6.5–8.1)

## 2024-05-06 LAB — URINE DRUG SCREEN
Amphetamines: NEGATIVE
Barbiturates: NEGATIVE
Benzodiazepines: NEGATIVE
Cocaine: NEGATIVE
Fentanyl: NEGATIVE
Methadone Scn, Ur: NEGATIVE
Opiates: NEGATIVE
Tetrahydrocannabinol: NEGATIVE

## 2024-05-06 LAB — URINALYSIS, ROUTINE W REFLEX MICROSCOPIC
Bilirubin Urine: NEGATIVE
Glucose, UA: NEGATIVE mg/dL
Hgb urine dipstick: NEGATIVE
Ketones, ur: NEGATIVE mg/dL
Leukocytes,Ua: NEGATIVE
Nitrite: NEGATIVE
Protein, ur: NEGATIVE mg/dL
Specific Gravity, Urine: 1.007 (ref 1.005–1.030)
pH: 7 (ref 5.0–8.0)

## 2024-05-06 LAB — LIPASE, BLOOD: Lipase: 32 U/L (ref 11–51)

## 2024-05-06 LAB — D-DIMER, QUANTITATIVE: D-Dimer, Quant: <0.27 ug{FEU}/mL (ref 0.00–0.50)

## 2024-05-06 LAB — TROPONIN T, HIGH SENSITIVITY: Troponin T High Sensitivity: <15 ng/L (ref 0–19)

## 2024-05-06 MED ORDER — LORAZEPAM 2 MG/ML IJ SOLN
1.0000 mg | Freq: Once | INTRAMUSCULAR | Status: AC
  Administered 2024-05-06: 1 mg via INTRAVENOUS
  Filled 2024-05-06: qty 1

## 2024-05-06 NOTE — ED Provider Notes (Signed)
 [place] EMERGENCY DEPARTMENT AT [facility] Provider Note   CSN: [id number] Arrival date & time: 05/06/24  9089     Patient presents with: Chest Pain   Terry Simmons is a 33 y.o. male.   Patient here chest pain anxiety shortness of breath.  He detox of alcohol fairly recently has been dealing with stress anxiety difficulty focusing headache.  He has been sober now since his hospital discharge.  Denies any weakness numbness tingling.  Nothing makes it worse or better.  Denies any fever or chills.  Has had headaches some vision changes at times.  Denies any recent surgery or travel otherwise.  No major cardiac risk factors otherwise.  The history is provided by the patient.       Prior to Admission medications   Medication Sig Start Date End Date Taking? Authorizing Provider  acetaminophen  (TYLENOL ) 500 MG tablet Take 2 tablets (1,000 mg total) by mouth every 8 (eight) hours as needed for mild pain (pain score 1-3), fever or headache. 03/01/24   Ricky Fines, MD  chlordiazePOXIDE  (LIBRIUM ) 5 MG capsule Take 2 tablets by mouth 3 times a day x 2 days; then take 1 tablet in the morning and 1 tablet around midday, with 2 tablets at nighttime for 2 consecutive days; then 1 tablet by mouth 3 times a day for 2 days; then 1 tablet by mouth twice a day for 3 days; then 1 tablet by mouth daily for 3 days and stop Librium . 03/01/24   Ricky Fines, MD  famotidine  (PEPCID ) 20 MG tablet Take 2 tablets (40 mg total) by mouth at bedtime. 03/01/24   Ricky Fines, MD  folic acid  (FOLVITE ) 1 MG tablet Take 1 tablet (1 mg total) by mouth daily. 03/01/24   Ricky Fines, MD  hydrOXYzine  (ATARAX ) 25 MG tablet Take 1 tablet (25 mg total) by mouth every 8 (eight) hours as needed for anxiety. 03/01/24   Ricky Fines, MD  methocarbamol (ROBAXIN) 500 MG tablet Take 500 mg by mouth 2 (two) times daily as needed for muscle spasms. 01/01/24   [provider]  pantoprazole  (PROTONIX ) 40  MG tablet Take 1 tablet (40 mg total) by mouth 2 (two) times daily before a meal. 03/01/24   Ricky Fines, MD  sucralfate  (CARAFATE ) 1 GM/10ML suspension Take 20 mLs (2 g total) by mouth 3 (three) times daily. 03/01/24   Ricky Fines, MD  thiamine  (VITAMIN B-1) 100 MG tablet Take 1 tablet (100 mg total) by mouth daily. 03/01/24   Ricky Fines, MD    Allergies: Strawberry (diagnostic)    Review of Systems  Updated Vital Signs BP (!) 161/101 (BP Location: Right Arm)   Pulse (!) 118   Temp 98.7 F (37.1 C) (Oral)   Resp 18   SpO2 99%   Physical Exam Vitals and nursing note reviewed.  Constitutional:      General: He is not in acute distress.    Appearance: He is well-developed. He is not ill-appearing.  HENT:     Head: Normocephalic and atraumatic.  Eyes:     Conjunctiva/sclera: Conjunctivae normal.  Cardiovascular:     Rate and Rhythm: Regular rhythm. Tachycardia present.     Pulses:          Radial pulses are 2+ on the right side and 2+ on the left side.     Heart sounds: Normal heart sounds. No murmur heard. Pulmonary:     Effort: Pulmonary effort is normal. No respiratory distress.  Breath sounds: Normal breath sounds.  Abdominal:     Palpations: Abdomen is soft.     Tenderness: There is no abdominal tenderness.  Musculoskeletal:        General: No swelling.     Cervical back: Neck supple.     Right lower leg: No edema.     Left lower leg: No edema.  Skin:    General: Skin is warm and dry.     Capillary Refill: Capillary refill takes less than 2 seconds.  Neurological:     General: No focal deficit present.     Mental Status: He is alert and oriented to person, place, and time.     Cranial Nerves: No cranial nerve deficit.     Motor: No weakness.     Comments: 5+ out of 5 strength throughout, normal sensation, no drift, normal finger-nose-finger, normal speech  Psychiatric:     Comments: Mildly anxious on exam     (all labs ordered are listed, but only  abnormal results are displayed) Labs Reviewed  COMPREHENSIVE METABOLIC PANEL WITH GFR - Abnormal; Notable for the following components:      Result Value   Glucose, Bld 103 (*)    All other components within normal limits  URINALYSIS, ROUTINE W REFLEX MICROSCOPIC - Abnormal; Notable for the following components:   Color, Urine COLORLESS (*)    All other components within normal limits  CBC WITH DIFFERENTIAL/PLATELET  LIPASE, BLOOD  URINE DRUG SCREEN  D-DIMER, QUANTITATIVE  TROPONIN T, HIGH SENSITIVITY    EKG: EKG Interpretation Date/Time:  Monday May 06 2024 09:28:25 EST Ventricular Rate:  109 PR Interval:  149 QRS Duration:  79 QT Interval:  315 QTC Calculation: 425 R Axis:   46  Text Interpretation: Sinus tachycardia Probable left atrial enlargement Confirmed by Ruthe Cornet [phone]) on 05/06/2024 9:31:52 AM  Radiology: [facility] Chest Portable 1 View Result Date: 05/06/2024 EXAM: PA AND LATERAL (2) VIEW(S) XRAY OF THE CHEST 05/06/2024 10:03:55 AM COMPARISON: PA and lateral radiographs of chest dated 12/09/2023. CLINICAL HISTORY: cp cp FINDINGS: LUNGS AND PLEURA: No focal pulmonary opacity. No pleural effusion. No pneumothorax. HEART AND MEDIASTINUM: No acute abnormality of the cardiac and mediastinal silhouettes. BONES AND SOFT TISSUES: No acute osseous abnormality. IMPRESSION: 1. No acute process. Electronically signed by: Evalene Coho MD 05/06/2024 10:59 AM EST RP Workstation: [id number]   CT Head Wo Contrast Result Date: 05/06/2024 EXAM: CT HEAD WITHOUT CONTRAST 05/06/2024 09:56:00 AM TECHNIQUE: CT of the head was performed without the administration of intravenous contrast. Automated exposure control, iterative reconstruction, and/or weight based adjustment of the mA/kV was utilized to reduce the radiation dose to as low as reasonably achievable. COMPARISON: None available. CLINICAL HISTORY: Headache, increasing frequency or severity Headache, increasing frequency or  severity FINDINGS: BRAIN AND VENTRICLES: No acute hemorrhage. No evidence of acute infarct. No hydrocephalus. No extra-axial collection. No mass effect or midline shift. ORBITS: No acute abnormality. SINUSES: No acute abnormality. SOFT TISSUES AND SKULL: No acute soft tissue abnormality. No skull fracture. IMPRESSION: 1. Normal. Electronically signed by: Evalene Coho MD 05/06/2024 10:04 AM EST RP Workstation: [id number]     Procedures   Medications Ordered in the ED  LORazepam  (ATIVAN ) injection 1 mg (1 mg Intravenous Given 05/06/24 0949)                                    Medical Decision Making Amount and/or Complexity of  Data Reviewed Labs: ordered. Radiology: ordered.  Risk Prescription drug management.   Terry Simmons is here with shortness of breath chest pain anxiety.  Tachycardic but otherwise unremarkable vitals.  EKG shows sinus rhythm.  No ischemic changes.  Differential diagnosis likely stressor related process but will evaluate for ACS PE infectious process anemia electrolyte abnormality.  He has been sober now for about 2 months.  Has not really been able to follow-up with primary care or psychiatry about his underlying anxiety.  Will give him a dose of IV Ativan  here to help with what is hopefully anxiety but will evaluate for organic process otherwise.  EKG shows sinus tachycardia.  No ischemic changes.  Lab work for my review and interpretation shows no significant leukocytosis anemia or electrolyte abnormality.  Chest x-ray normal.  D-dimer troponin drug screen all unremarkable.  He is feeling much better after Ativan .  I do think that this is likely stress related process.  Follow-up with behavioral Simmons primary care.  Return if symptoms worsen.  This chart was dictated using voice recognition software.  Despite best efforts to proofread,  errors can occur which can change the documentation meaning.      Final diagnoses:  Atypical chest pain  Anxiety     ED Discharge Orders     None          Ruthe Cornet, DO 05/06/24 1143

## 2024-05-06 NOTE — ED Triage Notes (Signed)
 Reports CP and SHOB since September. States was admitted to hospital in September for detoxing from alcohol.   Hx of anxiety.

## 2024-05-06 NOTE — Discharge Instructions (Signed)
 Follow-up with behavioral health/primary care.  Return if symptoms worsen.  Call [facility] and ask for outpatient follow-up to discuss anxiety.

## 2024-05-24 ENCOUNTER — Other Ambulatory Visit: Payer: Self-pay

## 2024-05-24 ENCOUNTER — Ambulatory Visit
Admission: RE | Admit: 2024-05-24 | Discharge: 2024-05-24 | Disposition: A | Discharge status: Home / Self Care | Attending: Internal Medicine | Admitting: Internal Medicine

## 2024-05-24 VITALS — BP 129/88 | HR 132 | Temp 99.7°F | Resp 20 | Ht 72.0 in | Wt 240.0 lb

## 2024-05-24 DIAGNOSIS — R509 Fever, unspecified: Secondary | ICD-10-CM | POA: Diagnosis not present

## 2024-05-24 DIAGNOSIS — J029 Acute pharyngitis, unspecified: Secondary | ICD-10-CM | POA: Diagnosis not present

## 2024-05-24 LAB — POCT INFLUENZA A/B
Influenza A, POC: NEGATIVE
Influenza B, POC: NEGATIVE

## 2024-05-24 LAB — POCT RAPID STREP A (OFFICE): Rapid Strep A Screen: NEGATIVE

## 2024-05-24 LAB — POC SOFIA SARS ANTIGEN FIA: SARS Coronavirus 2 Ag: NEGATIVE

## 2024-05-24 MED ORDER — ACETAMINOPHEN 325 MG PO TABS
975.0000 mg | ORAL_TABLET | Freq: Once | ORAL | Status: AC
  Administered 2024-05-24: 975 mg via ORAL

## 2024-05-24 MED ORDER — KETOROLAC TROMETHAMINE 30 MG/ML IJ SOLN
30.0000 mg | Freq: Once | INTRAMUSCULAR | Status: AC
  Administered 2024-05-24: 30 mg via INTRAMUSCULAR

## 2024-05-24 NOTE — ED Provider Notes (Signed)
 [facility]    CSN: [id number] Arrival date & time: 05/24/24  1343      History   Chief Complaint Chief Complaint  Patient presents with   Generalized Body Aches    Headache bodyaches, fever of 103, chills, body burning up - Entered by patient    HPI Terry Simmons is a 33 y.o. male.   Patient presents with 2-day history of generalized bodyaches, headache, runny nose, sore throat, fever.  Denies cough.  Has taken ibuprofen  at approximately 6:30 AM this morning for symptoms.  Denies any known sick contacts.  Denies history of asthma.     Past Medical History:  Diagnosis Date   Asthma    Renal disorder     Patient Active Problem List   Diagnosis Date Noted   Lactic acidosis 03/01/2024   Hypokalemia 03/01/2024   Hyponatremia 02/27/2024   Elevated liver enzymes 02/27/2024   Alcohol intoxication 02/27/2024   SIRS (systemic inflammatory response syndrome) (HCC) 02/27/2024    History reviewed. No pertinent surgical history.     Home Medications    Prior to Admission medications   Medication Sig Start Date End Date Taking? Authorizing Provider  acetaminophen  (TYLENOL ) 500 MG tablet Take 2 tablets (1,000 mg total) by mouth every 8 (eight) hours as needed for mild pain (pain score 1-3), fever or headache. 03/01/24   Ricky Fines, MD  chlordiazePOXIDE  (LIBRIUM ) 5 MG capsule Take 2 tablets by mouth 3 times a day x 2 days; then take 1 tablet in the morning and 1 tablet around midday, with 2 tablets at nighttime for 2 consecutive days; then 1 tablet by mouth 3 times a day for 2 days; then 1 tablet by mouth twice a day for 3 days; then 1 tablet by mouth daily for 3 days and stop Librium . 03/01/24   Ricky Fines, MD  famotidine  (PEPCID ) 20 MG tablet Take 2 tablets (40 mg total) by mouth at bedtime. 03/01/24   Ricky Fines, MD  folic acid  (FOLVITE ) 1 MG tablet Take 1 tablet (1 mg total) by mouth daily. 03/01/24   Ricky Fines, MD  hydrOXYzine  (ATARAX ) 25  MG tablet Take 1 tablet (25 mg total) by mouth every 8 (eight) hours as needed for anxiety. 03/01/24   Ricky Fines, MD  LORazepam  (ATIVAN ) 0.5 MG tablet Take 0.5 mg by mouth 2 (two) times daily as needed.    [provider]  methocarbamol (ROBAXIN) 500 MG tablet Take 500 mg by mouth 2 (two) times daily as needed for muscle spasms. 01/01/24   [provider]  pantoprazole  (PROTONIX ) 40 MG tablet Take 1 tablet (40 mg total) by mouth 2 (two) times daily before a meal. 03/01/24   Ricky Fines, MD  sucralfate  (CARAFATE ) 1 GM/10ML suspension Take 20 mLs (2 g total) by mouth 3 (three) times daily. 03/01/24   Ricky Fines, MD  thiamine  (VITAMIN B-1) 100 MG tablet Take 1 tablet (100 mg total) by mouth daily. 03/01/24   Ricky Fines, MD    Family History History reviewed. No pertinent family history.  Social History Social History   Tobacco Use   Smoking status: Former    Current packs/day: 1.00    Types: Cigarettes   Smokeless tobacco: Never  Vaping Use   Vaping status: Never Used  Substance Use Topics   Alcohol use: Yes    Comment: socially   Drug use: Yes    Types: Marijuana     Allergies   Strawberry (diagnostic)   Review of  Systems Review of Systems Per HPI  Physical Exam Triage Vital Signs ED Triage Vitals  Encounter Vitals Group     BP 05/24/24 1357 129/88     Girls Systolic BP Percentile --      Girls Diastolic BP Percentile --      Boys Systolic BP Percentile --      Boys Diastolic BP Percentile --      Pulse Rate 05/24/24 1357 (!) 132     Resp 05/24/24 1357 20     Temp 05/24/24 1357 (!) 103 F (39.4 C)     Temp Source 05/24/24 1357 Oral     SpO2 05/24/24 1357 96 %     Weight 05/24/24 1359 240 lb (108.9 kg)     Height 05/24/24 1359 6' (1.829 m)     Head Circumference --      Peak Flow --      Pain Score 05/24/24 1359 8     Pain Loc --      Pain Education --      Exclude from Growth Chart --    No data found.  Updated Vital Signs BP  129/88 (BP Location: Right Arm)   Pulse (!) 132   Temp 99.7 F (37.6 C) (Tympanic)   Resp 20   Ht 6' (1.829 m)   Wt 240 lb (108.9 kg)   SpO2 96%   BMI 32.55 kg/m   Visual Acuity Right Eye Distance:   Left Eye Distance:   Bilateral Distance:    Right Eye Near:   Left Eye Near:    Bilateral Near:     Physical Exam Constitutional:      General: He is not in acute distress.    Appearance: Normal appearance. He is not toxic-appearing or diaphoretic.  HENT:     Head: Normocephalic and atraumatic.     Right Ear: Tympanic membrane and ear canal normal.     Left Ear: Tympanic membrane and ear canal normal.     Nose: Congestion present.     Mouth/Throat:     Mouth: Mucous membranes are moist.     Pharynx: Posterior oropharyngeal erythema present. No pharyngeal swelling or oropharyngeal exudate.     Tonsils: No tonsillar exudate or tonsillar abscesses.  Eyes:     Extraocular Movements: Extraocular movements intact.     Conjunctiva/sclera: Conjunctivae normal.     Pupils: Pupils are equal, round, and reactive to light.  Cardiovascular:     Rate and Rhythm: Regular rhythm. Tachycardia present.     Pulses: Normal pulses.     Heart sounds: Normal heart sounds.  Pulmonary:     Effort: Pulmonary effort is normal. No respiratory distress.     Breath sounds: Normal breath sounds. No stridor. No wheezing, rhonchi or rales.  Musculoskeletal:        General: Normal range of motion.     Cervical back: Normal range of motion.  Skin:    General: Skin is warm and dry.  Neurological:     General: No focal deficit present.     Mental Status: He is alert and oriented to person, place, and time. Mental status is at baseline.  Psychiatric:        Mood and Affect: Mood normal.        Behavior: Behavior normal.      UC Treatments / Results  Labs (all labs ordered are listed, but only abnormal results are displayed) Labs Reviewed  CULTURE, GROUP A STREP [facility])  POCT INFLUENZA  A/B  POC  SOFIA SARS ANTIGEN FIA  POCT RAPID STREP A (OFFICE)    EKG   Radiology No results found.  Procedures Procedures (including critical care time)  Medications Ordered in UC Medications  acetaminophen  (TYLENOL ) tablet 975 mg (975 mg Oral Given 05/24/24 1409)  ketorolac  (TORADOL ) 30 MG/ML injection 30 mg (30 mg Intramuscular Given 05/24/24 1448)    Initial Impression / Assessment and Plan / UC Course  I have reviewed the triage vital signs and the nursing notes.  Pertinent labs & imaging results that were available during my care of the patient were reviewed by me and considered in my medical decision making (see chart for details).     Patient presents with symptoms likely from a viral upper respiratory infection. Do not suspect underlying cardiopulmonary process. Symptoms seem unlikely related to ACS, CHF or COPD exacerbations, pneumonia, pneumothorax. Patient is nontoxic appearing and not in need of emergent medical intervention.  Patient is tachycardic but suspect this is due to fever, so no further workup is necessary for this. Heart rate did improve to 120's.  Tylenol  and ketorolac  injection were administered with improvement in fever.  COVID, flu, strep all negative.  Throat culture pending.  Patient is sitting upright in chair in no acute distress and blood pressure is normal so do not think that ER evaluation is necessary.  Recommended symptom control with over the counter medications, fluids, fever monitoring and management, rest.  Advised no NSAIDs for at least 24 hours following injection today.    Return if symptoms fail to improve. Patient states understanding and is agreeable.  Discharged with PCP followup.  Final Clinical Impressions(s) / UC Diagnoses   Final diagnoses:  Fever, unspecified  Sore throat     Discharge Instructions      COVID, flu, strep tests all negative.  As we discussed, you have a viral illness that simply has to run its course with the help of  symptomatic treatment.  You may take Tylenol  as needed for fever.  You did receive a Toradol  injection today in urgent care which should help with bodyaches, inflammation, fever.  Do not take any ibuprofen , Advil , Aleve for at least 24 hours following Toradol  injection.  Ensure you are drinking plenty of fluids and resting.  Follow-up if any symptoms persist or worsen.     ED Prescriptions   None    I have reviewed the PDMP during this encounter.   Hazen Darryle BRAVO, [place] 05/24/24 [phone]

## 2024-05-24 NOTE — ED Triage Notes (Signed)
 Pt presenting with c/o body aches, headache, runny nose, elevated temperature x 2 days. Pt stated that he took Ibuprofen  this AM for body ache which was ineffective.

## 2024-05-24 NOTE — Discharge Instructions (Signed)
 COVID, flu, strep tests all negative.  As we discussed, you have a viral illness that simply has to run its course with the help of symptomatic treatment.  You may take Tylenol  as needed for fever.  You did receive a Toradol  injection today in urgent care which should help with bodyaches, inflammation, fever.  Do not take any ibuprofen , Advil , Aleve for at least 24 hours following Toradol  injection.  Ensure you are drinking plenty of fluids and resting.  Follow-up if any symptoms persist or worsen.

## 2024-05-27 ENCOUNTER — Encounter (HOSPITAL_COMMUNITY): Payer: Self-pay

## 2024-05-27 ENCOUNTER — Other Ambulatory Visit: Payer: Self-pay

## 2024-05-27 ENCOUNTER — Emergency Department (HOSPITAL_COMMUNITY)
Admission: EM | Admit: 2024-05-27 | Discharge: 2024-05-27 | Disposition: A | Discharge status: Home / Self Care | Attending: Emergency Medicine | Admitting: Emergency Medicine

## 2024-05-27 DIAGNOSIS — R519 Headache, unspecified: Secondary | ICD-10-CM

## 2024-05-27 HISTORY — DX: Other specified congenital musculoskeletal deformities: Q68.8

## 2024-05-27 LAB — CBC
HCT: 45.3 % (ref 39.0–52.0)
Hemoglobin: 15 g/dL (ref 13.0–17.0)
MCH: 28.8 pg (ref 26.0–34.0)
MCHC: 33.1 g/dL (ref 30.0–36.0)
MCV: 87.1 fL (ref 80.0–100.0)
Platelets: 199 K/uL (ref 150–400)
RBC: 5.2 MIL/uL (ref 4.22–5.81)
RDW: 12.9 % (ref 11.5–15.5)
WBC: 5.1 K/uL (ref 4.0–10.5)
nRBC: 0 % (ref 0.0–0.2)

## 2024-05-27 LAB — BASIC METABOLIC PANEL WITH GFR
Anion gap: 8 (ref 5–15)
BUN: 11 mg/dL (ref 6–20)
CO2: 26 mmol/L (ref 22–32)
Calcium: 8.8 mg/dL — ABNORMAL LOW (ref 8.9–10.3)
Chloride: 105 mmol/L (ref 98–111)
Creatinine, Ser: 0.69 mg/dL (ref 0.61–1.24)
GFR, Estimated: >60 mL/min (ref >60)
Glucose, Bld: 94 mg/dL (ref 70–99)
Potassium: 3.8 mmol/L (ref 3.5–5.1)
Sodium: 139 mmol/L (ref 135–145)

## 2024-05-27 LAB — CBG MONITORING, ED: Glucose-Capillary: 100 mg/dL — ABNORMAL HIGH (ref 70–99)

## 2024-05-27 LAB — CULTURE, GROUP A STREP (THRC)

## 2024-05-27 MED ORDER — METOCLOPRAMIDE HCL 5 MG/ML IJ SOLN
10.0000 mg | Freq: Once | INTRAMUSCULAR | Status: AC
  Administered 2024-05-27: 10 mg via INTRAVENOUS
  Filled 2024-05-27: qty 2

## 2024-05-27 MED ORDER — DIPHENHYDRAMINE HCL 50 MG/ML IJ SOLN
12.5000 mg | Freq: Once | INTRAMUSCULAR | Status: AC
  Administered 2024-05-27: 12.5 mg via INTRAVENOUS
  Filled 2024-05-27: qty 1

## 2024-05-27 MED ORDER — SODIUM CHLORIDE 0.9 % IV BOLUS
1000.0000 mL | Freq: Once | INTRAVENOUS | Status: AC
  Administered 2024-05-27: 1000 mL via INTRAVENOUS

## 2024-05-27 NOTE — ED Notes (Signed)
 Patient ambulated without difficulty prior to DC.

## 2024-05-27 NOTE — ED Notes (Signed)
   05/27/24 1403 05/27/24 1404 05/27/24 1406  Vitals  BP 117/79 120/89 (!) 148/100  MAP (mmHg) 89 98 114  Patient Position (if appropriate) Lying Sitting Standing  Pulse Rate 75 76 74     ORTHOSTATICS NEGATIVE

## 2024-05-27 NOTE — ED Provider Notes (Signed)
  EMERGENCY DEPARTMENT AT [facility] Provider Note   CSN: [id number] Arrival date & time: 05/27/24  1110     Patient presents with: Eye Problem, Dizziness, and Headache  HPI Terry Simmons is a 33 y.o. male presenting for headache, dizziness and visual disturbance.  He states that the symptoms have been occurring intermittently for the past 2 weeks.  He also reports that he had a fever in the past couple weeks but has been afebrile since Saturday not currently taking any antipyretics.  He states that the visual disturbance is in both eyes and he sees a shimmering in both eyes that comes and goes and lasts a few minutes.  Reports that he has had a really bad headache in the last week but much improved over the last couple days.  Has not mild headache in the back of his head today.  Had a normal CT head Noncon a couple weeks ago for a headache at that time.  Two days ago was seen at urgent care and was given Toradol  because of his fever and headache and stated that his symptoms did not improve.  Denies nuchal rigidity or brain fog.  Denies chest pain or shortness of breath.    Eye Problem Associated symptoms: headaches   Dizziness Associated symptoms: headaches   Headache Associated symptoms: dizziness        Prior to Admission medications   Medication Sig Start Date End Date Taking? Authorizing Provider  acetaminophen  (TYLENOL ) 500 MG tablet Take 2 tablets (1,000 mg total) by mouth every 8 (eight) hours as needed for mild pain (pain score 1-3), fever or headache. 03/01/24   Ricky Fines, MD  chlordiazePOXIDE  (LIBRIUM ) 5 MG capsule Take 2 tablets by mouth 3 times a day x 2 days; then take 1 tablet in the morning and 1 tablet around midday, with 2 tablets at nighttime for 2 consecutive days; then 1 tablet by mouth 3 times a day for 2 days; then 1 tablet by mouth twice a day for 3 days; then 1 tablet by mouth daily for 3 days and stop Librium . 03/01/24   Ricky Fines, MD  famotidine  (PEPCID ) 20 MG tablet Take 2 tablets (40 mg total) by mouth at bedtime. 03/01/24   Ricky Fines, MD  folic acid  (FOLVITE ) 1 MG tablet Take 1 tablet (1 mg total) by mouth daily. 03/01/24   Ricky Fines, MD  hydrOXYzine  (ATARAX ) 25 MG tablet Take 1 tablet (25 mg total) by mouth every 8 (eight) hours as needed for anxiety. 03/01/24   Ricky Fines, MD  LORazepam  (ATIVAN ) 0.5 MG tablet Take 0.5 mg by mouth 2 (two) times daily as needed.    [provider]  methocarbamol (ROBAXIN) 500 MG tablet Take 500 mg by mouth 2 (two) times daily as needed for muscle spasms. 01/01/24   [provider]  pantoprazole  (PROTONIX ) 40 MG tablet Take 1 tablet (40 mg total) by mouth 2 (two) times daily before a meal. 03/01/24   Ricky Fines, MD  sucralfate  (CARAFATE ) 1 GM/10ML suspension Take 20 mLs (2 g total) by mouth 3 (three) times daily. 03/01/24   Ricky Fines, MD  thiamine  (VITAMIN B-1) 100 MG tablet Take 1 tablet (100 mg total) by mouth daily. 03/01/24   Ricky Fines, MD    Allergies: Strawberry (diagnostic)    Review of Systems  Neurological:  Positive for dizziness and headaches.    Updated Vital Signs BP 107/76   Pulse 62   Temp 98.1 F (  36.7 C) (Oral)   Resp 19   Ht 6' (1.829 m)   Wt 108.9 kg   SpO2 100%   BMI 32.55 kg/m   Physical Exam Vitals and nursing note reviewed.  HENT:     Head: Normocephalic and atraumatic.     Mouth/Throat:     Mouth: Mucous membranes are moist.  Eyes:     General:        Right eye: No discharge.        Left eye: No discharge.     Conjunctiva/sclera: Conjunctivae normal.     Comments:   Visual Acuity  Right Eye Distance:   Left Eye Distance:   Bilateral Distance:    Right Eye Near: R Near: 10/6.3 Left Eye Near:  L Near: 20/25 Bilateral Near:  20/12.5   Cardiovascular:     Rate and Rhythm: Normal rate and regular rhythm.     Pulses: Normal pulses.     Heart sounds: Normal heart sounds.  Pulmonary:      Effort: Pulmonary effort is normal.     Breath sounds: Normal breath sounds.  Abdominal:     General: Abdomen is flat.     Palpations: Abdomen is soft.  Skin:    General: Skin is warm and dry.  Neurological:     General: No focal deficit present.     Comments: GCS 15. Speech is goal oriented. No deficits appreciated to CN III-XII; symmetric eyebrow raise, no facial drooping, tongue midline. Patient has equal grip strength bilaterally with 5/5 strength against resistance in all major muscle groups bilaterally. Sensation to light touch intact. Patient moves extremities without ataxia. Normal finger-nose-finger. Patient ambulatory with steady gait.  Psychiatric:        Mood and Affect: Mood normal.     (all labs ordered are listed, but only abnormal results are displayed) Labs Reviewed  BASIC METABOLIC PANEL WITH GFR - Abnormal; Notable for the following components:      Result Value   Calcium 8.8 (*)    All other components within normal limits  CBG MONITORING, ED - Abnormal; Notable for the following components:   Glucose-Capillary 100 (*)    All other components within normal limits  CBC    EKG: None  Radiology: No results found.   Procedures   Medications Ordered in the ED  sodium chloride  0.9 % bolus 1,000 mL (1,000 mLs Intravenous New Bag/Given 05/27/24 1358)  metoCLOPramide  (REGLAN ) injection 10 mg (10 mg Intravenous Given 05/27/24 1357)  diphenhydrAMINE  (BENADRYL ) injection 12.5 mg (12.5 mg Intravenous Given 05/27/24 1358)                                    Medical Decision Making Amount and/or Complexity of Data Reviewed Labs: ordered.  Risk Prescription drug management.   Initial Impression and Ddx 33 year old well-appearing male presenting for headache and visual disturbance.  Exam was unremarkable.  DDx includes stroke, meningitis, cerebral venous thrombosis, vascular dissection, which is an emergency, migraine with aura, other. Patient PMH that increases  complexity of ED encounter:  none  Interpretation of Diagnostics - I independent reviewed and interpreted the labs as followed: Unremarkable  -Per chart review, CT Noncon of his head 2 weeks ago was normal  Patient Reassessment and Ultimate Disposition/Management On reassessment he stated that headache had resolved resolved.  Overall he looks very well, afebrile and reassuring vital signs and unremarkable labs with reassuring CT  2 weeks ago.  Neuroexam here also normal.  Meningitis giving lack of meningismal signs and symptoms at this time.  Also doubt stroke given reassuring neuroexam and CT scan 2 weeks ago.  Vascular dissection also unlikely given his visual disturbances intermittent and he has been asymptomatic during this encounter.  Submitted ambulatory referral to neurology.  Discussed return precautions.  Discharged.   Patient management required discussion with the following services or consulting groups:  None  Complexity of Problems Addressed Acute complicated illness or Injury  Additional Data Reviewed and Analyzed Further history obtained from: Past medical history and medications listed in the EMR and Prior ED visit notes  Patient Encounter Risk Assessment Consideration of hospitalization      Final diagnoses:  Nonintractable headache, unspecified chronicity pattern, unspecified headache type    ED Discharge Orders          Ordered    Ambulatory referral to Neurology       Comments: An appointment is requested in approximately: 2 weeks   05/27/24 1552               Lang Norleen POUR, PA-C 05/27/24 1558    Charlyn Sora, MD 05/28/24 1014

## 2024-05-27 NOTE — ED Triage Notes (Signed)
 While pt was at [facility] on Friday he was noted to have a fever and he was given ketorolac  and APAP. He has not had a fever the last 2 days.

## 2024-05-27 NOTE — ED Triage Notes (Signed)
 Pt states that he has been having headaches, blurry vision which makes him dizzy and unsteady on his feet going on for a few weeks. Pt states he had a CT scan 2 weeks ago that was negative. Pt states all weekend he had fever and headache unrelieved by OTC meds.

## 2024-05-27 NOTE — Discharge Instructions (Signed)
 Evaluation for your headache and visual changes is overall reassuring.  As we discussed recommend that you follow-up with neurology in the outpatient setting.  If your symptoms return or worsen please return to the ED for further evaluation.

## 2024-05-27 NOTE — ED Notes (Signed)
   05/27/24 1414  Visual Acuity  Bilateral Near 20/12.5  R Near 10/6.3  L Near 20/25

## 2024-06-12 ENCOUNTER — Encounter: Payer: Self-pay | Admitting: Neurology

## 2024-07-26 NOTE — Progress Notes (Unsigned)
 "  Initial neurology clinic note  Jermar Colter MRN: [id number] DOB: [birth year]  Referring provider: Lang Norleen POUR, PA-C  Primary care provider: Chrystal Lamarr RAMAN, MD  Reason for consult:  headaches  Subjective:  This is Mr. Archibald Marchetta, a 34 y.o. ***-handed male with a medical history of arthrogryposis*** who presents to neurology clinic with headaches. The patient is accompanied by ***.  *** Presented to ED on 05/27/24 for HA, dizziness, and visual disturbance prompting neurology referral Intermittent HA for 2 weeks prior to ED visit Was having fevers at onset? Shimmering in both eyes  On thiamine  and folic acid ?  Smoker: OCP use: Caffiene use: EtOH use: Restrictive diet: Family history of neurologic disease including headaches:   MEDICATIONS:  Outpatient Encounter Medications as of 08/01/2024  Medication Sig   acetaminophen  (TYLENOL ) 500 MG tablet Take 2 tablets (1,000 mg total) by mouth every 8 (eight) hours as needed for mild pain (pain score 1-3), fever or headache.   chlordiazePOXIDE  (LIBRIUM ) 5 MG capsule Take 2 tablets by mouth 3 times a day x 2 days; then take 1 tablet in the morning and 1 tablet around midday, with 2 tablets at nighttime for 2 consecutive days; then 1 tablet by mouth 3 times a day for 2 days; then 1 tablet by mouth twice a day for 3 days; then 1 tablet by mouth daily for 3 days and stop Librium .   famotidine  (PEPCID ) 20 MG tablet Take 2 tablets (40 mg total) by mouth at bedtime.   folic acid  (FOLVITE ) 1 MG tablet Take 1 tablet (1 mg total) by mouth daily.   hydrOXYzine  (ATARAX ) 25 MG tablet Take 1 tablet (25 mg total) by mouth every 8 (eight) hours as needed for anxiety.   LORazepam  (ATIVAN ) 0.5 MG tablet Take 0.5 mg by mouth 2 (two) times daily as needed.   methocarbamol (ROBAXIN) 500 MG tablet Take 500 mg by mouth 2 (two) times daily as needed for muscle spasms.   pantoprazole  (PROTONIX ) 40 MG tablet Take 1 tablet (40 mg  total) by mouth 2 (two) times daily before a meal.   sucralfate  (CARAFATE ) 1 GM/10ML suspension Take 20 mLs (2 g total) by mouth 3 (three) times daily.   thiamine  (VITAMIN B-1) 100 MG tablet Take 1 tablet (100 mg total) by mouth daily.   No facility-administered encounter medications on file as of 08/01/2024.    PAST MEDICAL HISTORY: Past Medical History:  Diagnosis Date   Arthrogryposis    Asthma    Renal disorder     PAST SURGICAL HISTORY: No past surgical history on file.  ALLERGIES: Allergies[1]  FAMILY HISTORY: No family history on file.  SOCIAL HISTORY: Social History[2] Social History   Social History Narrative   ** Merged History Encounter **        Objective:  Vital Signs:  There were no vitals taken for this visit.  ***  Labs and Imaging review: Internal labs: 05/27/24: CBC unremarkable BMP unremarkable  External labs: ***  Imaging/Procedures: CT head wo contrast (05/06/24): BRAIN AND VENTRICLES: No acute hemorrhage. No evidence of acute infarct. No hydrocephalus. No extra-axial collection. No mass effect or midline shift.   ORBITS: No acute abnormality.   SINUSES: No acute abnormality.   SOFT TISSUES AND SKULL: No acute soft tissue abnormality. No skull fracture.   IMPRESSION: 1. Normal.  EKG (05/06/24): Sinus tachycardia; normal QT ***  Assessment/Plan:  Ashan Cueva is a 34 y.o. male who presents for evaluation of ***. ***  has a relevant medical history of ***. *** neurological examination is pertinent for ***. Available diagnostic data is significant for ***. This constellation of symptoms and objective data would most likely localize to ***. ***  PLAN: -Blood work: *** ***  -Return to clinic ***  The impression above as well as the plan as outlined below were extensively discussed with the patient (in the company of ***) who voiced understanding. All questions were answered to their satisfaction.  The patient was  counseled on pertinent fall precautions per the printed material provided today, and as noted under the Patient Instructions section below.***  When available, results of the above investigations and possible further recommendations will be communicated to the patient via telephone/MyChart. Patient to call office if not contacted after expected testing turnaround time.   Total time spent reviewing records, interview, history/exam, documentation, and coordination of care on day of encounter:  *** min   Thank you for allowing me to participate in patient's care.  If I can answer any additional questions, I would be pleased to do so.  Venetia Potters, MD   CC: Chrystal Lamarr RAMAN, MD [address] 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

## 2024-08-01 ENCOUNTER — Ambulatory Visit: Admitting: Neurology

## 2024-09-19 ENCOUNTER — Ambulatory Visit: Payer: Self-pay | Admitting: Neurology

## 2024-10-14 ENCOUNTER — Ambulatory Visit (HOSPITAL_COMMUNITY): Admitting: Registered Nurse
